# Patient Record
Sex: Female | Born: 1955
Health system: Southern US, Community
[De-identification: ages and names within clinical notes are randomized; demographics above are authoritative.]

## PROBLEM LIST (undated history)

## (undated) DIAGNOSIS — R3129 Other microscopic hematuria: Secondary | ICD-10-CM

## (undated) DIAGNOSIS — R29810 Facial weakness: Secondary | ICD-10-CM

## (undated) DIAGNOSIS — E785 Hyperlipidemia, unspecified: Secondary | ICD-10-CM

## (undated) DIAGNOSIS — E039 Hypothyroidism, unspecified: Secondary | ICD-10-CM

## (undated) DIAGNOSIS — K649 Unspecified hemorrhoids: Secondary | ICD-10-CM

## (undated) DIAGNOSIS — I10 Essential (primary) hypertension: Secondary | ICD-10-CM

## (undated) DIAGNOSIS — F5104 Psychophysiologic insomnia: Secondary | ICD-10-CM

## (undated) HISTORY — DX: Hypothyroidism, unspecified: E03.9

## (undated) HISTORY — DX: Facial weakness: R29.810

## (undated) HISTORY — PX: CYSTOSCOPY: SUR368

## (undated) HISTORY — PX: LAPAROSCOPIC CHOLECYSTECTOMY WITH COMMON DUCT EXPLORATION AND INTRAOP CHOLANGIOGRAM: SHX6333

## (undated) HISTORY — DX: Essential (primary) hypertension: I10

## (undated) HISTORY — DX: Unspecified hemorrhoids: K64.9

## (undated) HISTORY — PX: CHOLECYSTECTOMY: SHX55

## (undated) HISTORY — DX: Psychophysiologic insomnia: F51.04

## (undated) HISTORY — DX: Other microscopic hematuria: R31.29

## (undated) HISTORY — DX: Hyperlipidemia, unspecified: E78.5

---

## 2003-04-21 ENCOUNTER — Encounter: Admission: RE | Admit: 2003-04-21 | Discharge: 2003-04-21 | Payer: Self-pay | Admitting: Obstetrics and Gynecology

## 2003-04-21 ENCOUNTER — Other Ambulatory Visit: Admission: RE | Admit: 2003-04-21 | Discharge: 2003-04-21 | Payer: Self-pay | Admitting: Obstetrics and Gynecology

## 2005-12-28 ENCOUNTER — Emergency Department (HOSPITAL_COMMUNITY): Admission: EM | Admit: 2005-12-28 | Discharge: 2005-12-28 | Payer: Self-pay | Admitting: Emergency Medicine

## 2006-02-16 ENCOUNTER — Ambulatory Visit (HOSPITAL_COMMUNITY): Admission: RE | Admit: 2006-02-16 | Discharge: 2006-02-18 | Payer: Self-pay | Admitting: General Surgery

## 2010-08-04 ENCOUNTER — Other Ambulatory Visit: Payer: Self-pay | Admitting: Obstetrics and Gynecology

## 2014-03-12 ENCOUNTER — Ambulatory Visit (INDEPENDENT_AMBULATORY_CARE_PROVIDER_SITE_OTHER): Payer: BC Managed Care – PPO | Admitting: Podiatry

## 2014-03-12 VITALS — BP 133/79 | HR 82 | Resp 17

## 2014-03-12 DIAGNOSIS — M722 Plantar fascial fibromatosis: Secondary | ICD-10-CM

## 2014-03-12 MED ORDER — TRIAMCINOLONE ACETONIDE 10 MG/ML IJ SUSP
10.0000 mg | Freq: Once | INTRAMUSCULAR | Status: AC
  Administered 2014-03-12: 10 mg

## 2014-03-12 NOTE — Patient Instructions (Signed)
Plantar Fasciitis (Heel Spur Syndrome) with Rehab The plantar fascia is a fibrous, ligament-like, soft-tissue structure that spans the bottom of the foot. Plantar fasciitis is a condition that causes pain in the foot due to inflammation of the tissue. SYMPTOMS   Pain and tenderness on the underneath side of the foot.  Pain that worsens with standing or walking. CAUSES  Plantar fasciitis is caused by irritation and injury to the plantar fascia on the underneath side of the foot. Common mechanisms of injury include:  Direct trauma to bottom of the foot.  Damage to a small nerve that runs under the foot where the main fascia attaches to the heel bone.  Stress placed on the plantar fascia due to bone spurs. RISK INCREASES WITH:   Activities that place stress on the plantar fascia (running, jumping, pivoting, or cutting).  Poor strength and flexibility.  Improperly fitted shoes.  Tight calf muscles.  Flat feet.  Failure to warm-up properly before activity.  Obesity. PREVENTION  Warm up and stretch properly before activity.  Allow for adequate recovery between workouts.  Maintain physical fitness:  Strength, flexibility, and endurance.  Cardiovascular fitness.  Maintain a health body weight.  Avoid stress on the plantar fascia.  Wear properly fitted shoes, including arch supports for individuals who have flat feet. PROGNOSIS  If treated properly, then the symptoms of plantar fasciitis usually resolve without surgery. However, occasionally surgery is necessary. RELATED COMPLICATIONS   Recurrent symptoms that may result in a chronic condition.  Problems of the lower back that are caused by compensating for the injury, such as limping.  Pain or weakness of the foot during push-off following surgery.  Chronic inflammation, scarring, and partial or complete fascia tear, occurring more often from repeated injections. TREATMENT  Treatment initially involves the use of  ice and medication to help reduce pain and inflammation. The use of strengthening and stretching exercises may help reduce pain with activity, especially stretches of the Achilles tendon. These exercises may be performed at home or with a therapist. Your caregiver may recommend that you use heel cups of arch supports to help reduce stress on the plantar fascia. Occasionally, corticosteroid injections are given to reduce inflammation. If symptoms persist for greater than 6 months despite non-surgical (conservative), then surgery may be recommended.  MEDICATION   If pain medication is necessary, then nonsteroidal anti-inflammatory medications, such as aspirin and ibuprofen, or other minor pain relievers, such as acetaminophen, are often recommended.  Do not take pain medication within 7 days before surgery.  Prescription pain relievers may be given if deemed necessary by your caregiver. Use only as directed and only as much as you need.  Corticosteroid injections may be given by your caregiver. These injections should be reserved for the most serious cases, because they may only be given a certain number of times. HEAT AND COLD  Cold treatment (icing) relieves pain and reduces inflammation. Cold treatment should be applied for 10 to 15 minutes every 2 to 3 hours for inflammation and pain and immediately after any activity that aggravates your symptoms. Use ice packs or massage the area with a piece of ice (ice massage).  Heat treatment may be used prior to performing the stretching and strengthening activities prescribed by your caregiver, physical therapist, or athletic trainer. Use a heat pack or soak the injury in warm water. SEEK IMMEDIATE MEDICAL CARE IF:  Treatment seems to offer no benefit, or the condition worsens.  Any medications produce adverse side effects. EXERCISES RANGE   OF MOTION (ROM) AND STRETCHING EXERCISES - Plantar Fasciitis (Heel Spur Syndrome) These exercises may help you  when beginning to rehabilitate your injury. Your symptoms may resolve with or without further involvement from your physician, physical therapist or athletic trainer. While completing these exercises, remember:   Restoring tissue flexibility helps normal motion to return to the joints. This allows healthier, less painful movement and activity.  An effective stretch should be held for at least 30 seconds.  A stretch should never be painful. You should only feel a gentle lengthening or release in the stretched tissue. RANGE OF MOTION - Toe Extension, Flexion  Sit with your right / left leg crossed over your opposite knee.  Grasp your toes and gently pull them back toward the top of your foot. You should feel a stretch on the bottom of your toes and/or foot.  Hold this stretch for __________ seconds.  Now, gently pull your toes toward the bottom of your foot. You should feel a stretch on the top of your toes and or foot.  Hold this stretch for __________ seconds. Repeat __________ times. Complete this stretch __________ times per day.  RANGE OF MOTION - Ankle Dorsiflexion, Active Assisted  Remove shoes and sit on a chair that is preferably not on a carpeted surface.  Place right / left foot under knee. Extend your opposite leg for support.  Keeping your heel down, slide your right / left foot back toward the chair until you feel a stretch at your ankle or calf. If you do not feel a stretch, slide your bottom forward to the edge of the chair, while still keeping your heel down.  Hold this stretch for __________ seconds. Repeat __________ times. Complete this stretch __________ times per day.  STRETCH - Gastroc, Standing  Place hands on wall.  Extend right / left leg, keeping the front knee somewhat bent.  Slightly point your toes inward on your back foot.  Keeping your right / left heel on the floor and your knee straight, shift your weight toward the wall, not allowing your back to  arch.  You should feel a gentle stretch in the right / left calf. Hold this position for __________ seconds. Repeat __________ times. Complete this stretch __________ times per day. STRETCH - Soleus, Standing  Place hands on wall.  Extend right / left leg, keeping the other knee somewhat bent.  Slightly point your toes inward on your back foot.  Keep your right / left heel on the floor, bend your back knee, and slightly shift your weight over the back leg so that you feel a gentle stretch deep in your back calf.  Hold this position for __________ seconds. Repeat __________ times. Complete this stretch __________ times per day. STRETCH - Gastrocsoleus, Standing  Note: This exercise can place a lot of stress on your foot and ankle. Please complete this exercise only if specifically instructed by your caregiver.   Place the ball of your right / left foot on a step, keeping your other foot firmly on the same step.  Hold on to the wall or a rail for balance.  Slowly lift your other foot, allowing your body weight to press your heel down over the edge of the step.  You should feel a stretch in your right / left calf.  Hold this position for __________ seconds.  Repeat this exercise with a slight bend in your right / left knee. Repeat __________ times. Complete this stretch __________ times per day.    STRENGTHENING EXERCISES - Plantar Fasciitis (Heel Spur Syndrome)  These exercises may help you when beginning to rehabilitate your injury. They may resolve your symptoms with or without further involvement from your physician, physical therapist or athletic trainer. While completing these exercises, remember:   Muscles can gain both the endurance and the strength needed for everyday activities through controlled exercises.  Complete these exercises as instructed by your physician, physical therapist or athletic trainer. Progress the resistance and repetitions only as guided. STRENGTH -  Towel Curls  Sit in a chair positioned on a non-carpeted surface.  Place your foot on a towel, keeping your heel on the floor.  Pull the towel toward your heel by only curling your toes. Keep your heel on the floor.  If instructed by your physician, physical therapist or athletic trainer, add ____________________ at the end of the towel. Repeat __________ times. Complete this exercise __________ times per day. STRENGTH - Ankle Inversion  Secure one end of a rubber exercise band/tubing to a fixed object (table, pole). Loop the other end around your foot just before your toes.  Place your fists between your knees. This will focus your strengthening at your ankle.  Slowly, pull your big toe up and in, making sure the band/tubing is positioned to resist the entire motion.  Hold this position for __________ seconds.  Have your muscles resist the band/tubing as it slowly pulls your foot back to the starting position. Repeat __________ times. Complete this exercises __________ times per day.  Document Released: 04/18/2005 Document Revised: 07/11/2011 Document Reviewed: 07/31/2008 [employer] Patient Information 2015 [employer]. This information is not intended to replace advice given to you by your health care provider. Make sure you discuss any questions you have with your health care provider.  

## 2014-03-12 NOTE — Progress Notes (Signed)
   Subjective:    Patient ID: Oswaldo DoneKathleen M Ocallaghan, female    DOB: [birth year], 58 y.o.   MRN: [id number]  HPI 58 year old female presents the office today with complaints of left heel pain which has been ongoing for proximally 2 months. She states that she has pain in her heel on the bottom aspect which is worse in the morning/rest or after prolonged walking or standing. She states that she's been trying Motrin as well as ice to the area without much resolution of symptoms. She denies any trauma to the area. No other complaints at this time.   Review of Systems  All other systems reviewed and are negative.      Objective:   Physical Exam AAO x3, NAD DP/PT pulses palpable bilaterally, CRT less than 3 seconds Protective sensation intact with Simms Weinstein monofilament, vibratory sensation intact, Achilles tendon reflex intact Tenderness to palpation over the plantar medial tubercle of the left calcaneus at the insertion of the plantar fascia. There is no pain with lateral compression of the calcaneus or vibratory sensation. There is no pain along the posterior aspect of the calcaneus or along the course of the Achilles tendon. No pain on the course of the plantar fascia within the arch of the foot and the plantar fascia appears to be intact. No overlying edema, erythema, increased warmth. No tenderness to palpation over the right foot. MMT 5/5, ROM WNL       Assessment & Plan:  58 year old female with left heel pain, likely plantar fasciitis. -Previous x-rays were reviewed. No acute fracture identified. Plantar calcaneal heel spurring identified. -Conservative versus surgical options were discussed including alternatives, risks, complications. -Patient elects to proceed with steroid injection into the left heel. Under sterile skin preparation, a total of 2.5cc of kenalog 10, 0.5% Marcaine plain, and 2% lidocaine plain were infiltrated into the symptomatic area without complication. A band-aid  was applied. Patient tolerated the injection well without complication.  -Ice to the effected area. -Discussed stretching exercises. -Dispensed plantar fascial brace. -Discussed shoe gear modifications and possible orthotics to help support her foot type. -follow-up in 3 weeks or sooner if any problems are to arise. In the meantime, call the office with any questions, concerns, change in symptoms.

## 2014-04-02 ENCOUNTER — Other Ambulatory Visit: Payer: Self-pay

## 2014-04-04 ENCOUNTER — Encounter: Payer: Self-pay | Admitting: Podiatry

## 2014-04-04 ENCOUNTER — Ambulatory Visit (INDEPENDENT_AMBULATORY_CARE_PROVIDER_SITE_OTHER): Payer: BC Managed Care – PPO | Admitting: Podiatry

## 2014-04-04 VITALS — BP 134/94 | HR 96 | Resp 13

## 2014-04-04 DIAGNOSIS — M722 Plantar fascial fibromatosis: Secondary | ICD-10-CM

## 2014-04-04 LAB — CYTOLOGY - PAP

## 2014-04-04 MED ORDER — TRIAMCINOLONE ACETONIDE 10 MG/ML IJ SUSP
10.0000 mg | Freq: Once | INTRAMUSCULAR | Status: AC
  Administered 2014-04-04: 10 mg

## 2014-04-04 NOTE — Progress Notes (Signed)
Patient ID: Oswaldo DoneKathleen M Erhart, female   DOB: 06-17-1955, 58 y.o.   MRN: [id number]  Subjective: 58 year old female returns the office they for follow-up evaluation of left heel pain, plantar fasciitis. She states that since the injection last appointment she has had much improvement in her symptoms although she does continue have some slight discomfort. She states that the pain is mostly after periods of ambulation and walking. She denies any recent swelling or redness over the area. No recent trauma. Denies any systemic complaints as fevers, chills, nausea, vomiting. No other complaints at this time in no acute changes since last appointment.  Objective: AAO x3, NAD DP/PT pulses palpable bilaterally, CRT less than 3 seconds  Protective sensation intact with Simms Weinstein monofilament, vibratory sensation intact, Achilles tendon reflex intact Tenderness to palpation overlying the plantar medial tubercle of the calcaneus at the insertion of the plantar fascia on the left foot. There is no pain along the course of plantar fascial in the arch of the foot. There is no pain with lateral compression of the calcaneus or pain with vibratory sensation. No pain on the posterior aspect of the calcaneus on the course of/insertion of the Achilles tendon. MMT 5/5, ROM WNL No open lesions or pre-ulcerative lesions. No pain with calf compression, swelling, warmth, erythema  Assessment: 58 year old female with left heel pain, plantar fasciitis.  Plan: -Treatment options discussed including all alternatives, risks, complications. -Patient elects to proceed with steroid injection into the left heel. Under sterile skin preparation, a total of 2.5cc of kenalog 10, 0.5% Marcaine plain, and 2% lidocaine plain were infiltrated into the symptomatic area without complication. A band-aid was applied. Patient tolerated the injection well without complication. Discussed with the patient to ice the area over the next couple of  days to help prevent a steroid flare.  -Continue ice to the area -Continue stretching exercises.  -Discussed proper shoegear -Follow-up in 3 weeks, or sooner if any problems are to arise. In the meantime, call the office with any questions/concerns/change in symptoms.

## 2014-04-04 NOTE — Patient Instructions (Signed)
Plantar Fasciitis (Heel Spur Syndrome) with Rehab The plantar fascia is a fibrous, ligament-like, soft-tissue structure that spans the bottom of the foot. Plantar fasciitis is a condition that causes pain in the foot due to inflammation of the tissue. SYMPTOMS   Pain and tenderness on the underneath side of the foot.  Pain that worsens with standing or walking. CAUSES  Plantar fasciitis is caused by irritation and injury to the plantar fascia on the underneath side of the foot. Common mechanisms of injury include:  Direct trauma to bottom of the foot.  Damage to a small nerve that runs under the foot where the main fascia attaches to the heel bone.  Stress placed on the plantar fascia due to bone spurs. RISK INCREASES WITH:   Activities that place stress on the plantar fascia (running, jumping, pivoting, or cutting).  Poor strength and flexibility.  Improperly fitted shoes.  Tight calf muscles.  Flat feet.  Failure to warm-up properly before activity.  Obesity. PREVENTION  Warm up and stretch properly before activity.  Allow for adequate recovery between workouts.  Maintain physical fitness:  Strength, flexibility, and endurance.  Cardiovascular fitness.  Maintain a health body weight.  Avoid stress on the plantar fascia.  Wear properly fitted shoes, including arch supports for individuals who have flat feet. PROGNOSIS  If treated properly, then the symptoms of plantar fasciitis usually resolve without surgery. However, occasionally surgery is necessary. RELATED COMPLICATIONS   Recurrent symptoms that may result in a chronic condition.  Problems of the lower back that are caused by compensating for the injury, such as limping.  Pain or weakness of the foot during push-off following surgery.  Chronic inflammation, scarring, and partial or complete fascia tear, occurring more often from repeated injections. TREATMENT  Treatment initially involves the use of  ice and medication to help reduce pain and inflammation. The use of strengthening and stretching exercises may help reduce pain with activity, especially stretches of the Achilles tendon. These exercises may be performed at home or with a therapist. Your caregiver may recommend that you use heel cups of arch supports to help reduce stress on the plantar fascia. Occasionally, corticosteroid injections are given to reduce inflammation. If symptoms persist for greater than 6 months despite non-surgical (conservative), then surgery may be recommended.  MEDICATION   If pain medication is necessary, then nonsteroidal anti-inflammatory medications, such as aspirin and ibuprofen, or other minor pain relievers, such as acetaminophen, are often recommended.  Do not take pain medication within 7 days before surgery.  Prescription pain relievers may be given if deemed necessary by your caregiver. Use only as directed and only as much as you need.  Corticosteroid injections may be given by your caregiver. These injections should be reserved for the most serious cases, because they may only be given a certain number of times. HEAT AND COLD  Cold treatment (icing) relieves pain and reduces inflammation. Cold treatment should be applied for 10 to 15 minutes every 2 to 3 hours for inflammation and pain and immediately after any activity that aggravates your symptoms. Use ice packs or massage the area with a piece of ice (ice massage).  Heat treatment may be used prior to performing the stretching and strengthening activities prescribed by your caregiver, physical therapist, or athletic trainer. Use a heat pack or soak the injury in warm water. SEEK IMMEDIATE MEDICAL CARE IF:  Treatment seems to offer no benefit, or the condition worsens.  Any medications produce adverse side effects. EXERCISES RANGE   OF MOTION (ROM) AND STRETCHING EXERCISES - Plantar Fasciitis (Heel Spur Syndrome) These exercises may help you  when beginning to rehabilitate your injury. Your symptoms may resolve with or without further involvement from your physician, physical therapist or athletic trainer. While completing these exercises, remember:   Restoring tissue flexibility helps normal motion to return to the joints. This allows healthier, less painful movement and activity.  An effective stretch should be held for at least 30 seconds.  A stretch should never be painful. You should only feel a gentle lengthening or release in the stretched tissue. RANGE OF MOTION - Toe Extension, Flexion  Sit with your right / left leg crossed over your opposite knee.  Grasp your toes and gently pull them back toward the top of your foot. You should feel a stretch on the bottom of your toes and/or foot.  Hold this stretch for __________ seconds.  Now, gently pull your toes toward the bottom of your foot. You should feel a stretch on the top of your toes and or foot.  Hold this stretch for __________ seconds. Repeat __________ times. Complete this stretch __________ times per day.  RANGE OF MOTION - Ankle Dorsiflexion, Active Assisted  Remove shoes and sit on a chair that is preferably not on a carpeted surface.  Place right / left foot under knee. Extend your opposite leg for support.  Keeping your heel down, slide your right / left foot back toward the chair until you feel a stretch at your ankle or calf. If you do not feel a stretch, slide your bottom forward to the edge of the chair, while still keeping your heel down.  Hold this stretch for __________ seconds. Repeat __________ times. Complete this stretch __________ times per day.  STRETCH - Gastroc, Standing  Place hands on wall.  Extend right / left leg, keeping the front knee somewhat bent.  Slightly point your toes inward on your back foot.  Keeping your right / left heel on the floor and your knee straight, shift your weight toward the wall, not allowing your back to  arch.  You should feel a gentle stretch in the right / left calf. Hold this position for __________ seconds. Repeat __________ times. Complete this stretch __________ times per day. STRETCH - Soleus, Standing  Place hands on wall.  Extend right / left leg, keeping the other knee somewhat bent.  Slightly point your toes inward on your back foot.  Keep your right / left heel on the floor, bend your back knee, and slightly shift your weight over the back leg so that you feel a gentle stretch deep in your back calf.  Hold this position for __________ seconds. Repeat __________ times. Complete this stretch __________ times per day. STRETCH - Gastrocsoleus, Standing  Note: This exercise can place a lot of stress on your foot and ankle. Please complete this exercise only if specifically instructed by your caregiver.   Place the ball of your right / left foot on a step, keeping your other foot firmly on the same step.  Hold on to the wall or a rail for balance.  Slowly lift your other foot, allowing your body weight to press your heel down over the edge of the step.  You should feel a stretch in your right / left calf.  Hold this position for __________ seconds.  Repeat this exercise with a slight bend in your right / left knee. Repeat __________ times. Complete this stretch __________ times per day.    STRENGTHENING EXERCISES - Plantar Fasciitis (Heel Spur Syndrome)  These exercises may help you when beginning to rehabilitate your injury. They may resolve your symptoms with or without further involvement from your physician, physical therapist or athletic trainer. While completing these exercises, remember:   Muscles can gain both the endurance and the strength needed for everyday activities through controlled exercises.  Complete these exercises as instructed by your physician, physical therapist or athletic trainer. Progress the resistance and repetitions only as guided. STRENGTH -  Towel Curls  Sit in a chair positioned on a non-carpeted surface.  Place your foot on a towel, keeping your heel on the floor.  Pull the towel toward your heel by only curling your toes. Keep your heel on the floor.  If instructed by your physician, physical therapist or athletic trainer, add ____________________ at the end of the towel. Repeat __________ times. Complete this exercise __________ times per day. STRENGTH - Ankle Inversion  Secure one end of a rubber exercise band/tubing to a fixed object (table, pole). Loop the other end around your foot just before your toes.  Place your fists between your knees. This will focus your strengthening at your ankle.  Slowly, pull your big toe up and in, making sure the band/tubing is positioned to resist the entire motion.  Hold this position for __________ seconds.  Have your muscles resist the band/tubing as it slowly pulls your foot back to the starting position. Repeat __________ times. Complete this exercises __________ times per day.  Document Released: 04/18/2005 Document Revised: 07/11/2011 Document Reviewed: 07/31/2008 [employer] Patient Information 2015 [employer]. This information is not intended to replace advice given to you by your health care provider. Make sure you discuss any questions you have with your health care provider.  

## 2014-04-07 ENCOUNTER — Other Ambulatory Visit: Payer: Self-pay | Admitting: Obstetrics

## 2014-04-07 DIAGNOSIS — R928 Other abnormal and inconclusive findings on diagnostic imaging of breast: Secondary | ICD-10-CM

## 2014-04-17 ENCOUNTER — Encounter (INDEPENDENT_AMBULATORY_CARE_PROVIDER_SITE_OTHER): Payer: Self-pay

## 2014-04-17 ENCOUNTER — Ambulatory Visit
Admission: RE | Admit: 2014-04-17 | Discharge: 2014-04-17 | Disposition: A | Discharge status: Home / Self Care | Payer: BC Managed Care – PPO | Source: Ambulatory Visit | Attending: Obstetrics | Admitting: Obstetrics

## 2014-04-17 DIAGNOSIS — R928 Other abnormal and inconclusive findings on diagnostic imaging of breast: Secondary | ICD-10-CM

## 2014-04-22 ENCOUNTER — Other Ambulatory Visit: Payer: Self-pay

## 2014-05-07 ENCOUNTER — Ambulatory Visit: Payer: BC Managed Care – PPO | Admitting: Podiatry

## 2015-04-17 ENCOUNTER — Other Ambulatory Visit: Payer: Self-pay

## 2015-04-17 DIAGNOSIS — Z1231 Encounter for screening mammogram for malignant neoplasm of breast: Secondary | ICD-10-CM

## 2015-04-30 ENCOUNTER — Other Ambulatory Visit: Payer: Self-pay | Admitting: Internal Medicine

## 2015-04-30 DIAGNOSIS — R29810 Facial weakness: Secondary | ICD-10-CM

## 2015-05-07 ENCOUNTER — Ambulatory Visit
Admission: RE | Admit: 2015-05-07 | Discharge: 2015-05-07 | Disposition: A | Discharge status: Home / Self Care | Payer: BLUE CROSS/BLUE SHIELD | Source: Ambulatory Visit

## 2015-05-07 DIAGNOSIS — Z1231 Encounter for screening mammogram for malignant neoplasm of breast: Secondary | ICD-10-CM

## 2015-05-12 ENCOUNTER — Ambulatory Visit
Admission: RE | Admit: 2015-05-12 | Discharge: 2015-05-12 | Disposition: A | Discharge status: Home / Self Care | Payer: BLUE CROSS/BLUE SHIELD | Source: Ambulatory Visit | Attending: Internal Medicine | Admitting: Internal Medicine

## 2015-05-12 DIAGNOSIS — R29810 Facial weakness: Secondary | ICD-10-CM

## 2015-05-14 ENCOUNTER — Ambulatory Visit (INDEPENDENT_AMBULATORY_CARE_PROVIDER_SITE_OTHER): Payer: BLUE CROSS/BLUE SHIELD | Admitting: Neurology

## 2015-05-14 ENCOUNTER — Encounter: Payer: Self-pay | Admitting: Neurology

## 2015-05-14 VITALS — BP 148/98 | HR 80 | Resp 18 | Ht 65.75 in | Wt 254.0 lb

## 2015-05-14 DIAGNOSIS — G518 Other disorders of facial nerve: Secondary | ICD-10-CM

## 2015-05-14 DIAGNOSIS — G510 Bell's palsy: Secondary | ICD-10-CM

## 2015-05-14 DIAGNOSIS — G5139 Clonic hemifacial spasm, unspecified: Secondary | ICD-10-CM

## 2015-05-14 NOTE — Patient Instructions (Signed)
You have mild Bell's Palsy: weakness of your facial muscle due to irritation of your facial nerve on the left. Bell's palsy gets better with time, and it can take months. Continue doing your facial exercises. Use artificial tears to your affected eye during the day as needed if you have dry eyes. Your brain MRI was fine.  You also have evidence of what we call hemifacial spasm which causes twitching of the facial muscles. This is mild at this time and can improve with time. If it gets worse, we can consider botulinum toxin injections to reduce your facial twitching. For now, we can play it by ear.  Please ask your husband, if you snore and if so, how loud it is, and if you have breathing related issues in your sleep, such as: snorting sounds, choking sounds, pauses in your breathing or shallow breathing events. These may be symptoms of obstructive sleep apnea (OSA).

## 2015-05-14 NOTE — Progress Notes (Signed)
Subjective:    Patient ID: Brittany Riley is a 60 y.o. female.  HPI     Huston FoleySaima Kainoah Bartosiewicz, MD, PhD [facility] [address], [address] [address] [place], [place] [address]  Dear Dr. [name],   I saw your patient, Brittany CongoKathleen Boddy, upon your kind request in my neurologic clinic today for initial consultation of her facial weakness. The patient is unaccompanied today. As you know, Ms. Brittany RuedFaison is a 60 year old right-handed woman with an underlying medical history of hypertension, hyperlipidemia, hypothyroidism, and morbid obesity, who reports new onset left facial weakness and left facial twitching. Her symptoms started in September 2016 when she first noticed intermittent twitching of her left face. Around Thanksgiving 2016 she noticed facial weakness. Since then, she has improved some with regards to her facial weakness. Her facial twitching is not always the same. It comes and goes. It is somewhat bothersome to her but not all the time. She has no pain. She had no prodromal illness as far she recalls. She had no other weakness or numbness or tingling elsewhere in her body. She had no headache and no ear pain, no hearing loss or exaggerated sound sensitivity. Overall, she feels quite well. She also recalls no additional stressors or problems at the time. She feels that she sleeps fairly well but she does snore some. She is motivated to lose weight. She denies any gasping sensations, morning headaches but does have occasional nocturia.  You ordered a brain MRI. She had a brain MRI without contrast on 05/12/2015: IMPRESSION: Normal for age noncontrast MRI appearance of the brain.  In addition, I personally reviewed the images through the PACS system.   I reviewed your office note from 04/22/2015, which you kindly included.  Her Past Medical History Is Significant For: Past Medical History  Diagnosis Date  . Hypertension   . Hemorrhoids   . Dyslipidemia   . Microscopic  hematuria   . Hypothyroidism   . Facial droop   . Chronic insomnia     Her Past Surgical History Is Significant For: Past Surgical History  Procedure Laterality Date  . Laparoscopic cholecystectomy with common duct exploration and intraop cholangiogram    . Cystoscopy    . Cesarean section    . Cholecystectomy      Her Family History Is Significant For: Family History  Problem Relation Age of Onset  . Hypertension Mother   . COPD Mother   . Diabetes Father   . Coronary artery disease Father   . Hypertension Sister   . Hodgkin's lymphoma Brother     Her Social History Is Significant For: Social History   Social History  . Marital Status: Married    Spouse Name: N/A  . Number of Children: 2  . Years of Education: College   Occupational History  . None     Social History Main Topics  . Smoking status: Never Smoker   . Smokeless tobacco: None  . Alcohol Use: 0.0 oz/week    0 Standard drinks or equivalent per week     Comment: Rare  . Drug Use: No  . Sexual Activity: Not Asked   Other Topics Concern  . None   Social History Narrative   Drinks 2 diet sodas a day     Her Allergies Are:  Allergies  Allergen Reactions  . Ampicillin   . Penicillins Rash  :   Her Current Medications Are:  Outpatient Encounter Prescriptions as of 05/14/2015  Medication Sig  .  levothyroxine (SYNTHROID, LEVOTHROID) 75 MCG tablet Take 75 mcg by mouth daily before breakfast.  . losartan (COZAAR) 100 MG tablet Take 100 mg by mouth daily.  . magnesium oxide (MAG-OX) 400 MG tablet Take 400 mg by mouth daily.   No facility-administered encounter medications on file as of 05/14/2015.  :   Review of Systems:  Out of a complete 14 point review of systems, all are reviewed and negative with the exception of these symptoms as listed below:   Review of Systems  Neurological:       Patient reports that 01/2015 she started having L facial spasms. Started to notice L facial droop around  Thanksgiving 2016. Having some decreased sensation in upper lip.     Objective:  Neurologic Exam  Physical Exam Physical Examination:   Filed Vitals:   05/14/15 0952  BP: 148/98  Pulse: 80  Resp: 18    General Examination: The patient is a very pleasant 60 y.o. female in no acute distress. She appears well-developed and well-nourished and well groomed. She is obese.  HEENT: Normocephalic, atraumatic, pupils are equal, round and reactive to light and accommodation. Funduscopic exam is normal with sharp disc margins noted. Extraocular tracking is good without limitation to gaze excursion or nystagmus noted. Normal smooth pursuit is noted. Hearing is grossly intact. Face is mildly asymmetric with normal facial animation on the right and mild left facial weakness noticed on the L with depressed NLF and with normal facial sensation and mild intermittent L hemi-facial spasms noted in the L lower lid area, L mid and lower face around chin. Speech is clear with no dysarthria noted. There is no hypophonia. There is no lip, neck/head, jaw or voice tremor. Neck is supple with full range of passive and active motion. There are no carotid bruits on auscultation. Oropharynx exam reveals: mild mouth dryness, adequate dental hygiene and moderate airway crowding, due to narrow airway entry and tonsils in place, uvula appears slightly elongated. Mallampati is class II. Tongue protrudes centrally and palate elevates symmetrically.   Chest: Clear to auscultation without wheezing, rhonchi or crackles noted.  Heart: S1+S2+0, regular and normal without murmurs, rubs or gallops noted.   Abdomen: Soft, non-tender and non-distended with normal bowel sounds appreciated on auscultation.  Extremities: There is no pitting edema in the distal lower extremities bilaterally. Pedal pulses are intact.  Skin: Warm and dry without trophic changes noted. There are no varicose veins.  Musculoskeletal: exam reveals no obvious  joint deformities, tenderness or joint swelling or erythema.   Neurologically:  Mental status: The patient is awake, alert and oriented in all 4 spheres. Her immediate and remote memory, attention, language skills and fund of knowledge are appropriate. There is no evidence of aphasia, agnosia, apraxia or anomia. Speech is clear with normal prosody and enunciation. Thought process is linear. Mood is normal and affect is normal.  Cranial nerves II - XII are as described above under HEENT exam. In addition: shoulder shrug is normal with equal shoulder height noted. Motor exam: Normal bulk, strength and tone is noted. There is no drift, tremor or rebound. Romberg is negative. Reflexes are 2+ throughout. Babinski: Toes are flexor bilaterally. Fine motor skills and coordination: intact with normal finger taps, normal hand movements, normal rapid alternating patting, normal foot taps and normal foot agility.  Cerebellar testing: No dysmetria or intention tremor on finger to nose testing. Heel to shin is unremarkable bilaterally. There is no truncal or gait ataxia.  Sensory exam: intact to  light touch, pinprick, vibration, temperature sense in the upper and lower extremities.  Gait, station and balance: She stands easily. No veering to one side is noted. No leaning to one side is noted. Posture is age-appropriate and stance is narrow based. Gait shows normal stride length and normal pace. No problems turning are noted. She turns en bloc. Tandem walk is unremarkable.  Assessment and Plan:   In summary, VARNELL DONATE is a very pleasant 60 y.o.-year old female with an underlying medical history of hypertension, hyperlipidemia, hypothyroidism, and morbid obesity, who  started having left facial twitching and left facial weakness a couple of months ago. First twitching she recalls started first in September 2016 and facial weakness started late in November 2016. On examination, she has mild evidence of facial  weakness on the left and mild evidence of left hemifacial spasms. Symptoms are mild and may have improved some. MRI was negative and was discussed with her. She is reassured that her neurological exam otherwise is nonfocal. At this juncture, I suggested that she continue with facial exercises, use eyedrops to the left eye for lubrication as needed. If the facial twitching becomes worse or starts bothering her she is advised to call, we discussed potential use of botulinum toxin injections to reduce facial spasms, otherwise, she is advised, that there is no specific treatment available. With time, she may improve. I answered all her questions today and she was in agreement with an as needed follow-up with me. In addition, we also talked about her sleep some today because she  has a crowded looking airway and snores and is obese. She does not endorse any telltale symptoms of obstructive sleep apnea but is advised to talk to her husband about her sleep and whether he has noticed any abnormal sounds or pauses in her breathing while she is asleep. At this juncture, I will see her back on an as-needed basis.   Thank you very much for allowing me to participate in the care of this nice patient. If I can be of any further assistance to you please do not hesitate to call me at [phone].  Sincerely,   [name], MD, PhD

## 2015-06-03 DIAGNOSIS — G510 Bell's palsy: Secondary | ICD-10-CM | POA: Insufficient documentation

## 2015-06-03 DIAGNOSIS — I10 Essential (primary) hypertension: Secondary | ICD-10-CM | POA: Insufficient documentation

## 2015-06-03 DIAGNOSIS — E039 Hypothyroidism, unspecified: Secondary | ICD-10-CM | POA: Insufficient documentation

## 2015-08-25 ENCOUNTER — Telehealth: Payer: Self-pay | Admitting: Neurology

## 2015-08-25 NOTE — Telephone Encounter (Signed)
A muscle relaxer is not recommended for Bell's palsy and twitching of the face, as it can cause side effects such as sleepiness and typically does not help very much for facial twitching. If anything, we can make an appointment for evaluation for Botox injections as discussed during our appointment in January 2017. Please call patient back to discuss.

## 2015-08-25 NOTE — Telephone Encounter (Signed)
Patient is calling and is asking if a muscle relaxer could help release the muscle in her face (Bell's Palsy patient)?  Please call her [phone].

## 2015-08-26 NOTE — Telephone Encounter (Signed)
FYI: Patient is aware of information below and recommendations. She does not want to pursue Botox at this time she was just wondering if there were any alternatives.

## 2015-12-23 DIAGNOSIS — H524 Presbyopia: Secondary | ICD-10-CM | POA: Diagnosis not present

## 2016-05-09 DIAGNOSIS — Z0000 Encounter for general adult medical examination without abnormal findings: Secondary | ICD-10-CM | POA: Diagnosis not present

## 2016-05-09 DIAGNOSIS — E038 Other specified hypothyroidism: Secondary | ICD-10-CM | POA: Diagnosis not present

## 2016-05-09 DIAGNOSIS — I10 Essential (primary) hypertension: Secondary | ICD-10-CM | POA: Diagnosis not present

## 2016-05-09 DIAGNOSIS — R8299 Other abnormal findings in urine: Secondary | ICD-10-CM | POA: Diagnosis not present

## 2016-05-12 ENCOUNTER — Other Ambulatory Visit: Payer: Self-pay | Admitting: Internal Medicine

## 2016-05-12 DIAGNOSIS — Z1231 Encounter for screening mammogram for malignant neoplasm of breast: Secondary | ICD-10-CM

## 2016-05-23 DIAGNOSIS — Z1389 Encounter for screening for other disorder: Secondary | ICD-10-CM | POA: Diagnosis not present

## 2016-05-23 DIAGNOSIS — E038 Other specified hypothyroidism: Secondary | ICD-10-CM | POA: Diagnosis not present

## 2016-05-23 DIAGNOSIS — E784 Other hyperlipidemia: Secondary | ICD-10-CM | POA: Diagnosis not present

## 2016-05-23 DIAGNOSIS — Z0000 Encounter for general adult medical examination without abnormal findings: Secondary | ICD-10-CM | POA: Diagnosis not present

## 2016-05-23 DIAGNOSIS — R29810 Facial weakness: Secondary | ICD-10-CM | POA: Diagnosis not present

## 2016-05-26 ENCOUNTER — Ambulatory Visit
Admission: RE | Admit: 2016-05-26 | Discharge: 2016-05-26 | Disposition: A | Discharge status: Home / Self Care | Payer: BLUE CROSS/BLUE SHIELD | Source: Ambulatory Visit | Attending: Internal Medicine | Admitting: Internal Medicine

## 2016-05-26 DIAGNOSIS — Z1231 Encounter for screening mammogram for malignant neoplasm of breast: Secondary | ICD-10-CM | POA: Diagnosis not present

## 2016-06-07 DIAGNOSIS — Z6841 Body Mass Index (BMI) 40.0 and over, adult: Secondary | ICD-10-CM | POA: Diagnosis not present

## 2016-06-07 DIAGNOSIS — Z01419 Encounter for gynecological examination (general) (routine) without abnormal findings: Secondary | ICD-10-CM | POA: Diagnosis not present

## 2017-04-07 DIAGNOSIS — H524 Presbyopia: Secondary | ICD-10-CM | POA: Diagnosis not present

## 2017-05-18 DIAGNOSIS — I10 Essential (primary) hypertension: Secondary | ICD-10-CM | POA: Diagnosis not present

## 2017-05-18 DIAGNOSIS — Z0000 Encounter for general adult medical examination without abnormal findings: Secondary | ICD-10-CM | POA: Diagnosis not present

## 2017-05-18 DIAGNOSIS — R82998 Other abnormal findings in urine: Secondary | ICD-10-CM | POA: Diagnosis not present

## 2017-05-25 DIAGNOSIS — Z1389 Encounter for screening for other disorder: Secondary | ICD-10-CM | POA: Diagnosis not present

## 2017-05-25 DIAGNOSIS — Z0000 Encounter for general adult medical examination without abnormal findings: Secondary | ICD-10-CM | POA: Diagnosis not present

## 2017-05-25 DIAGNOSIS — M25561 Pain in right knee: Secondary | ICD-10-CM | POA: Diagnosis not present

## 2017-05-25 DIAGNOSIS — E038 Other specified hypothyroidism: Secondary | ICD-10-CM | POA: Diagnosis not present

## 2017-05-25 DIAGNOSIS — I10 Essential (primary) hypertension: Secondary | ICD-10-CM | POA: Diagnosis not present

## 2017-05-25 DIAGNOSIS — E7849 Other hyperlipidemia: Secondary | ICD-10-CM | POA: Diagnosis not present

## 2017-05-30 ENCOUNTER — Other Ambulatory Visit: Payer: Self-pay | Admitting: Internal Medicine

## 2017-05-30 DIAGNOSIS — Z139 Encounter for screening, unspecified: Secondary | ICD-10-CM

## 2017-06-22 ENCOUNTER — Ambulatory Visit: Payer: BLUE CROSS/BLUE SHIELD

## 2017-07-11 ENCOUNTER — Ambulatory Visit: Payer: BLUE CROSS/BLUE SHIELD

## 2017-07-13 DIAGNOSIS — Z124 Encounter for screening for malignant neoplasm of cervix: Secondary | ICD-10-CM | POA: Diagnosis not present

## 2017-07-13 DIAGNOSIS — Z6841 Body Mass Index (BMI) 40.0 and over, adult: Secondary | ICD-10-CM | POA: Diagnosis not present

## 2017-07-13 DIAGNOSIS — Z01419 Encounter for gynecological examination (general) (routine) without abnormal findings: Secondary | ICD-10-CM | POA: Diagnosis not present

## 2017-07-26 ENCOUNTER — Ambulatory Visit
Admission: RE | Admit: 2017-07-26 | Discharge: 2017-07-26 | Disposition: A | Discharge status: Home / Self Care | Payer: BLUE CROSS/BLUE SHIELD | Source: Ambulatory Visit | Attending: Internal Medicine | Admitting: Internal Medicine

## 2017-07-26 DIAGNOSIS — Z1231 Encounter for screening mammogram for malignant neoplasm of breast: Secondary | ICD-10-CM | POA: Diagnosis not present

## 2017-07-26 DIAGNOSIS — Z139 Encounter for screening, unspecified: Secondary | ICD-10-CM

## 2017-08-10 DIAGNOSIS — E038 Other specified hypothyroidism: Secondary | ICD-10-CM | POA: Diagnosis not present

## 2018-05-22 DIAGNOSIS — Z0000 Encounter for general adult medical examination without abnormal findings: Secondary | ICD-10-CM | POA: Diagnosis not present

## 2018-05-22 DIAGNOSIS — E038 Other specified hypothyroidism: Secondary | ICD-10-CM | POA: Diagnosis not present

## 2018-05-22 DIAGNOSIS — R82998 Other abnormal findings in urine: Secondary | ICD-10-CM | POA: Diagnosis not present

## 2018-05-22 DIAGNOSIS — I10 Essential (primary) hypertension: Secondary | ICD-10-CM | POA: Diagnosis not present

## 2018-05-29 DIAGNOSIS — I10 Essential (primary) hypertension: Secondary | ICD-10-CM | POA: Diagnosis not present

## 2018-05-29 DIAGNOSIS — Z0000 Encounter for general adult medical examination without abnormal findings: Secondary | ICD-10-CM | POA: Diagnosis not present

## 2018-05-29 DIAGNOSIS — Z1331 Encounter for screening for depression: Secondary | ICD-10-CM | POA: Diagnosis not present

## 2018-05-29 DIAGNOSIS — M25561 Pain in right knee: Secondary | ICD-10-CM | POA: Diagnosis not present

## 2018-05-29 DIAGNOSIS — B351 Tinea unguium: Secondary | ICD-10-CM | POA: Diagnosis not present

## 2018-06-22 ENCOUNTER — Other Ambulatory Visit: Payer: Self-pay | Admitting: Internal Medicine

## 2018-06-22 DIAGNOSIS — Z1231 Encounter for screening mammogram for malignant neoplasm of breast: Secondary | ICD-10-CM

## 2018-07-31 ENCOUNTER — Ambulatory Visit: Payer: BLUE CROSS/BLUE SHIELD

## 2018-09-20 ENCOUNTER — Ambulatory Visit: Payer: BLUE CROSS/BLUE SHIELD

## 2018-10-03 DIAGNOSIS — Z01419 Encounter for gynecological examination (general) (routine) without abnormal findings: Secondary | ICD-10-CM | POA: Diagnosis not present

## 2018-10-11 ENCOUNTER — Ambulatory Visit (INDEPENDENT_AMBULATORY_CARE_PROVIDER_SITE_OTHER): Payer: BC Managed Care – PPO

## 2018-10-11 ENCOUNTER — Other Ambulatory Visit: Payer: Self-pay

## 2018-10-11 ENCOUNTER — Ambulatory Visit (INDEPENDENT_AMBULATORY_CARE_PROVIDER_SITE_OTHER): Payer: BC Managed Care – PPO | Admitting: Podiatry

## 2018-10-11 ENCOUNTER — Encounter: Payer: Self-pay | Admitting: Podiatry

## 2018-10-11 VITALS — Temp 97.2°F

## 2018-10-11 DIAGNOSIS — M79671 Pain in right foot: Secondary | ICD-10-CM

## 2018-10-11 DIAGNOSIS — M722 Plantar fascial fibromatosis: Secondary | ICD-10-CM | POA: Diagnosis not present

## 2018-10-11 MED ORDER — MELOXICAM 15 MG PO TABS: 15.0000 mg | ORAL_TABLET | Freq: Every day | ORAL | 0 refills | Status: DC

## 2018-10-11 MED ORDER — TRIAMCINOLONE ACETONIDE 10 MG/ML IJ SUSP
10.0000 mg | Freq: Once | INTRAMUSCULAR | Status: AC
  Administered 2018-10-11: 10 mg

## 2018-10-11 NOTE — Patient Instructions (Signed)

## 2018-10-17 NOTE — Progress Notes (Signed)
Subjective:   Patient ID: Brittany Riley, female   DOB: 63 y.o.   MRN: [id number]   HPI 63 year old female presents the office today for concerns of recurrent right heel pain which is been ongoing for last 2 to 3 months.  She states it hurts on when she first gets up and gets better with walking.  Describes it as sore, tender.  No numbness or tingling.  Has an occasional swelling.  No recent injury.  No other concerns today.   Review of Systems  All other systems reviewed and are negative.  Past Medical History:  Diagnosis Date  . Chronic insomnia   . Dyslipidemia   . Facial droop   . Hemorrhoids   . Hypertension   . Hypothyroidism   . Microscopic hematuria     Past Surgical History:  Procedure Laterality Date  . CESAREAN SECTION    . CHOLECYSTECTOMY    . CYSTOSCOPY    . LAPAROSCOPIC CHOLECYSTECTOMY WITH COMMON DUCT EXPLORATION AND INTRAOP CHOLANGIOGRAM       Current Outpatient Medications:  .  levothyroxine (SYNTHROID, LEVOTHROID) 75 MCG tablet, Take 75 mcg by mouth daily before breakfast., Disp: , Rfl:  .  losartan (COZAAR) 100 MG tablet, Take 100 mg by mouth daily., Disp: , Rfl:  .  meloxicam (MOBIC) 15 MG tablet, Take 1 tablet (15 mg total) by mouth daily., Disp: 14 tablet, Rfl: 0  Allergies  Allergen Reactions  . Ampicillin   . Penicillins Rash        Objective:  Physical Exam  General: AAO x3, NAD  Dermatological: Skin is warm, dry and supple bilateral. Nails x 10 are well manicured; remaining integument appears unremarkable at this time. There are no open sores, no preulcerative lesions, no rash or signs of infection present.  Vascular: Dorsalis Pedis artery and Posterior Tibial artery pedal pulses are 2/4 bilateral with immedate capillary fill time. Pedal hair growth present. No varicosities and no lower extremity edema present bilateral. There is no pain with calf compression, swelling, warmth, erythema.   Neruologic: Grossly intact via light touch  bilateral. Vibratory intact via tuning fork bilateral. Protective threshold with Semmes Wienstein monofilament intact to all pedal sites bilateral. Negative tinel sign.   Musculoskeletal: Tenderness to palpation along the plantar medial tubercle of the calcaneus at the insertion of plantar fascia on the right foot. There is no pain along the course of the plantar fascia within the arch of the foot. Plantar fascia appears to be intact. There is no pain with lateral compression of the calcaneus or pain with vibratory sensation. There is no pain along the course or insertion of the achilles tendon. No other areas of tenderness to bilateral lower extremities. Muscular strength 5/5 in all groups tested bilateral.  Gait: Unassisted, Nonantalgic.       Assessment:   Right heel pain, plantar fasciitis     Plan:  -Treatment options discussed including all alternatives, risks, and complications -Etiology of symptoms were discussed -X-rays were obtained and reviewed with the patient. No evidence of acute fracture or stress fracture.  -Steroid injection- see procedure note below -Plantar fascial brace. -Stretching, icing daily. -Discussed shoe modifications and orthotics.  Procedure: Injection Tendon/Ligament Discussed alternatives, risks, complications and verbal consent was obtained.  Location: Right plantar fascia at the glabrous junction; medial approach. Skin Prep: Alcohol  Injectate: 0.5cc 0.5% marcaine plain, 0.5 cc 2% lidocaine plain and, 1 cc kenalog 10. Disposition: Patient tolerated procedure well. Injection site dressed with a band-aid.  Post-injection  care was discussed and return precautions discussed.   Trula Slade DPM

## 2018-10-31 ENCOUNTER — Other Ambulatory Visit: Payer: Self-pay

## 2018-10-31 ENCOUNTER — Ambulatory Visit
Admission: RE | Admit: 2018-10-31 | Discharge: 2018-10-31 | Disposition: A | Discharge status: Home / Self Care | Payer: BC Managed Care – PPO | Source: Ambulatory Visit | Attending: Internal Medicine | Admitting: Internal Medicine

## 2018-10-31 DIAGNOSIS — Z1231 Encounter for screening mammogram for malignant neoplasm of breast: Secondary | ICD-10-CM | POA: Diagnosis not present

## 2018-11-15 ENCOUNTER — Ambulatory Visit (INDEPENDENT_AMBULATORY_CARE_PROVIDER_SITE_OTHER): Payer: BC Managed Care – PPO | Admitting: Podiatry

## 2018-11-15 ENCOUNTER — Other Ambulatory Visit: Payer: Self-pay | Admitting: Podiatry

## 2018-11-15 ENCOUNTER — Telehealth: Payer: Self-pay | Admitting: *Deleted

## 2018-11-15 ENCOUNTER — Other Ambulatory Visit: Payer: Self-pay

## 2018-11-15 DIAGNOSIS — M722 Plantar fascial fibromatosis: Secondary | ICD-10-CM

## 2018-11-15 DIAGNOSIS — M79671 Pain in right foot: Secondary | ICD-10-CM

## 2018-11-15 NOTE — Telephone Encounter (Signed)
Pharmacy sent a refill request over for Mobic and I called and spoke with patient and refilled 30 tablets and no refills and per Dr Jacqualyn Posey was ok to send over. Brittany Riley

## 2018-11-16 NOTE — Progress Notes (Signed)
Subjective: 63 year old female presents the office today for follow-up evaluation of right heel pain.  She states that it was doing good when she was on the meloxicam however when she finished this the pain started to come back.  She points more to the bottom, lateral aspect where she gets the discomfort now.  Injection also helped.  She has pain more in the morning.  She states that she tries to wear supportive shoes except when she is off the yard doing work and this is when she notices some pain as well. Denies any systemic complaints such as fevers, chills, nausea, vomiting. No acute changes since last appointment, and no other complaints at this time.   Objective: AAO x3, NAD DP/PT pulses palpable bilaterally, CRT less than 3 seconds There is tenderness palpation on the plantar aspect of the right heel mostly in the plantar lateral aspect.  There is no pain lateral compression of calcaneus.  No pain in the course of the Achilles tendon.  No other areas of tenderness.  Negative heel sign. No open lesions or pre-ulcerative lesions.  No pain with calf compression, swelling, warmth, erythema  Assessment: Right heel pain, plantar fascial  Plan: -All treatment options discussed with the patient including all alternatives, risks, complications.  -Steroid injection performed.  See procedure note below.  Discussed changing shoes particularly when she is doing yard work and wearing more supportive shoes.  Continue stretching, icing daily. -Patient encouraged to call the office with any questions, concerns, change in symptoms.   Procedure: Injection Tendon/Ligament Discussed alternatives, risks, complications and verbal consent was obtained.  Location: RIGHT  plantar fascia at the glabrous junction; lateral approach. Skin Prep: Alcohol  Injectate: 0.5cc 0.5% marcaine plain, 0.5 cc 2% lidocaine plain and, 1 cc kenalog 10. Disposition: Patient tolerated procedure well. Injection site dressed with a  band-aid.  Post-injection care was discussed and return precautions discussed.    Trula Slade DPM

## 2018-11-29 DIAGNOSIS — B351 Tinea unguium: Secondary | ICD-10-CM | POA: Diagnosis not present

## 2018-11-29 DIAGNOSIS — I10 Essential (primary) hypertension: Secondary | ICD-10-CM | POA: Diagnosis not present

## 2018-12-08 ENCOUNTER — Other Ambulatory Visit: Payer: Self-pay | Admitting: Podiatry

## 2018-12-10 ENCOUNTER — Telehealth: Payer: Self-pay | Admitting: *Deleted

## 2018-12-10 NOTE — Telephone Encounter (Signed)
Called and left a message for the patient to call me back regarding a refill of meloxicam. Lattie Haw

## 2018-12-12 ENCOUNTER — Telehealth: Payer: Self-pay | Admitting: *Deleted

## 2018-12-12 NOTE — Telephone Encounter (Signed)
Per Dr Jacqualyn Posey ok to refill the meloxicam. Lattie Haw

## 2019-05-24 DIAGNOSIS — H524 Presbyopia: Secondary | ICD-10-CM | POA: Diagnosis not present

## 2019-05-28 DIAGNOSIS — Z20828 Contact with and (suspected) exposure to other viral communicable diseases: Secondary | ICD-10-CM | POA: Diagnosis not present

## 2019-05-28 DIAGNOSIS — Z0000 Encounter for general adult medical examination without abnormal findings: Secondary | ICD-10-CM | POA: Diagnosis not present

## 2019-05-28 DIAGNOSIS — E7849 Other hyperlipidemia: Secondary | ICD-10-CM | POA: Diagnosis not present

## 2019-05-28 DIAGNOSIS — E038 Other specified hypothyroidism: Secondary | ICD-10-CM | POA: Diagnosis not present

## 2019-05-31 DIAGNOSIS — R82998 Other abnormal findings in urine: Secondary | ICD-10-CM | POA: Diagnosis not present

## 2019-05-31 DIAGNOSIS — E7849 Other hyperlipidemia: Secondary | ICD-10-CM | POA: Diagnosis not present

## 2019-06-04 DIAGNOSIS — Z0000 Encounter for general adult medical examination without abnormal findings: Secondary | ICD-10-CM | POA: Diagnosis not present

## 2019-06-04 DIAGNOSIS — Z1331 Encounter for screening for depression: Secondary | ICD-10-CM | POA: Diagnosis not present

## 2019-06-04 DIAGNOSIS — M25561 Pain in right knee: Secondary | ICD-10-CM | POA: Diagnosis not present

## 2019-06-04 DIAGNOSIS — E785 Hyperlipidemia, unspecified: Secondary | ICD-10-CM | POA: Diagnosis not present

## 2019-06-04 DIAGNOSIS — I10 Essential (primary) hypertension: Secondary | ICD-10-CM | POA: Diagnosis not present

## 2019-06-10 DIAGNOSIS — Z1211 Encounter for screening for malignant neoplasm of colon: Secondary | ICD-10-CM | POA: Diagnosis not present

## 2019-07-02 DIAGNOSIS — D122 Benign neoplasm of ascending colon: Secondary | ICD-10-CM | POA: Diagnosis not present

## 2019-07-02 DIAGNOSIS — D123 Benign neoplasm of transverse colon: Secondary | ICD-10-CM | POA: Diagnosis not present

## 2019-07-02 DIAGNOSIS — K635 Polyp of colon: Secondary | ICD-10-CM | POA: Diagnosis not present

## 2019-07-02 DIAGNOSIS — Z1211 Encounter for screening for malignant neoplasm of colon: Secondary | ICD-10-CM | POA: Diagnosis not present

## 2019-09-03 DIAGNOSIS — E039 Hypothyroidism, unspecified: Secondary | ICD-10-CM | POA: Diagnosis not present

## 2019-09-03 DIAGNOSIS — I10 Essential (primary) hypertension: Secondary | ICD-10-CM | POA: Diagnosis not present

## 2019-09-03 DIAGNOSIS — F5104 Psychophysiologic insomnia: Secondary | ICD-10-CM | POA: Diagnosis not present

## 2019-09-26 ENCOUNTER — Ambulatory Visit (INDEPENDENT_AMBULATORY_CARE_PROVIDER_SITE_OTHER): Payer: BLUE CROSS/BLUE SHIELD | Admitting: Neurology

## 2019-09-26 ENCOUNTER — Encounter: Payer: Self-pay | Admitting: Neurology

## 2019-09-26 ENCOUNTER — Other Ambulatory Visit: Payer: Self-pay

## 2019-09-26 VITALS — BP 130/92 | HR 85 | Ht 66.0 in | Wt 307.3 lb

## 2019-09-26 DIAGNOSIS — Z6841 Body Mass Index (BMI) 40.0 and over, adult: Secondary | ICD-10-CM

## 2019-09-26 DIAGNOSIS — Z87448 Personal history of other diseases of urinary system: Secondary | ICD-10-CM | POA: Diagnosis not present

## 2019-09-26 DIAGNOSIS — R519 Headache, unspecified: Secondary | ICD-10-CM

## 2019-09-26 DIAGNOSIS — R0681 Apnea, not elsewhere classified: Secondary | ICD-10-CM

## 2019-09-26 DIAGNOSIS — R351 Nocturia: Secondary | ICD-10-CM | POA: Diagnosis not present

## 2019-09-26 DIAGNOSIS — Z9189 Other specified personal risk factors, not elsewhere classified: Secondary | ICD-10-CM

## 2019-09-26 DIAGNOSIS — G479 Sleep disorder, unspecified: Secondary | ICD-10-CM

## 2019-09-26 DIAGNOSIS — R0683 Snoring: Secondary | ICD-10-CM | POA: Diagnosis not present

## 2019-09-26 DIAGNOSIS — G4700 Insomnia, unspecified: Secondary | ICD-10-CM

## 2019-09-26 NOTE — Progress Notes (Signed)
Subjective:    Patient ID: Brittany Riley is a 64 y.o. female.  HPI     Brittany Foley, MD, PhD [facility] [address] [address] [place], [place] [address]  Dear Dr. Jarold Motto,  I saw your patient, Brittany Riley, upon your kind request in my sleep clinic today for initial consultation of her sleep disorder, in particular, chronic difficulty initiating maintaining sleep, concern for underlying obstructive sleep apnea.  The patient is unaccompanied today.  As you know, Brittany Riley is a 64 year old right-handed woman with an underlying medical history of hypertension, hypothyroidism, hyperlipidemia, history of Bell's palsy in 2016, for which I saw her some 4 years ago, and morbid obesity with a BMI of over 45, who reports snoring and nonrestorative sleep, difficulty falling asleep.  She has significant nocturia about 3 times per average night and has had rare morning headaches, has also experienced on a rare occasion nocturnal enuresis.  She goes to bed around 930 but likes to read in bed, she may fall asleep around 11 PM.  She reports sinus congestion at night and some postnasal drip but no history of allergies per se.  She wakes up with a dry mouth.  She has no family history of sleep apnea.  She lives with her husband, no pets in the house.  She has had witnessed apneas per husband's report.  Her Epworth sleepiness score is 2 out of 24, fatigue severity score is 18 out of 63.  She does not currently work. I reviewed your office note from 09/03/2019.  She has had weight gain in the recent past.  She was recently started on carvedilol.  Previously:  05/14/2015: 64 year old right-handed woman with an underlying medical history of hypertension, hyperlipidemia, hypothyroidism, and morbid obesity, who reports new onset left facial weakness and left facial twitching. Her symptoms started in September 2016 when she first noticed intermittent twitching of her left face.  Around Thanksgiving 2016 she noticed facial weakness. Since then, she has improved some with regards to her facial weakness. Her facial twitching is not always the same. It comes and goes. It is somewhat bothersome to her but not all the time. She has no pain. She had no prodromal illness as far she recalls. She had no other weakness or numbness or tingling elsewhere in her body. She had no headache and no ear pain, no hearing loss or exaggerated sound sensitivity. Overall, she feels quite well. She also recalls no additional stressors or problems at the time. She feels that she sleeps fairly well but she does snore some. She is motivated to lose weight. She denies any gasping sensations, morning headaches but does have occasional nocturia.   You ordered a brain MRI. She had a brain MRI without contrast on 05/12/2015: IMPRESSION: Normal for age noncontrast MRI appearance of the brain.   In addition, I personally reviewed the images through the PACS system.    I reviewed your office note from 04/22/2015, which you kindly included.  Her Past Medical History Is Significant For: Past Medical History:  Diagnosis Date  . Chronic insomnia   . Dyslipidemia   . Facial droop   . Hemorrhoids   . Hypertension   . Hypothyroidism   . Microscopic hematuria     Her Past Surgical History Is Significant For: Past Surgical History:  Procedure Laterality Date  . CESAREAN SECTION    . CHOLECYSTECTOMY    . CYSTOSCOPY    . LAPAROSCOPIC CHOLECYSTECTOMY WITH COMMON DUCT  EXPLORATION AND INTRAOP CHOLANGIOGRAM      Her Family History Is Significant For: Family History  Problem Relation Age of Onset  . Hypertension Mother   . COPD Mother   . Diabetes Father   . Coronary artery disease Father   . Hypertension Sister   . Hodgkin's lymphoma Brother   . Breast cancer Neg Hx     Her Social History Is Significant For: Social History   Socioeconomic History  . Marital status: Married    Spouse name: Not  on file  . Number of children: 2  . Years of education: College  . Highest education level: Not on file  Occupational History  . Occupation: None   Tobacco Use  . Smoking status: Never Smoker  . Smokeless tobacco: Never Used  Substance and Sexual Activity  . Alcohol use: Yes    Alcohol/week: 0.0 standard drinks    Comment: Rare  . Drug use: No  . Sexual activity: Not on file  Other Topics Concern  . Not on file  Social History Narrative   Drinks 2 diet sodas a day    Social Determinants of Health   Financial Resource Strain:   . Difficulty of Paying Living Expenses:   Food Insecurity:   . Worried About Programme researcher, broadcasting/film/video in the Last Year:   . Barista in the Last Year:   Transportation Needs:   . Freight forwarder (Medical):   Marland Kitchen Lack of Transportation (Non-Medical):   Physical Activity:   . Days of Exercise per Week:   . Minutes of Exercise per Session:   Stress:   . Feeling of Stress :   Social Connections:   . Frequency of Communication with Friends and Family:   . Frequency of Social Gatherings with Friends and Family:   . Attends Religious Services:   . Active Member of Clubs or Organizations:   . Attends Banker Meetings:   Marland Kitchen Marital Status:     Her Allergies Are:  Allergies  Allergen Reactions  . Ampicillin   . Penicillins Rash  :   Her Current Medications Are:  Outpatient Encounter Medications as of 09/26/2019  Medication Sig  . amLODipine (NORVASC) 5 MG tablet Take 5 mg by mouth daily.  . carvedilol (COREG) 3.125 MG tablet Take 3.125 mg by mouth 2 (two) times daily.  . hydrochlorothiazide (MICROZIDE) 12.5 MG capsule Take 12.5 mg by mouth daily.  Marland Kitchen levothyroxine (SYNTHROID) 100 MCG tablet Take 100 mcg by mouth daily.  Marland Kitchen losartan (COZAAR) 100 MG tablet Take 100 mg by mouth daily.  . [DISCONTINUED] levothyroxine (SYNTHROID, LEVOTHROID) 75 MCG tablet Take 75 mcg by mouth daily before breakfast.  . [DISCONTINUED] meloxicam  (MOBIC) 15 MG tablet TAKE 1 TABLET BY MOUTH EVERY DAY   No facility-administered encounter medications on file as of 09/26/2019.  :  Review of Systems:  Out of a complete 14 point review of systems, all are reviewed and negative with the exception of these symptoms as listed below: Review of Systems  Neurological:       Here for sleep consult. No prior sleep study. Pt reports snoring is present.  Epworth Sleepiness Scale 0= would never doze 1= slight chance of dozing 2= moderate chance of dozing 3= high chance of dozing  Sitting and reading:0 Watching TV:0 Sitting inactive in a public place (ex. Theater or meeting):0 As a passenger in a car for an hour without a break:1 Lying down to rest in the  afternoon:1 Sitting and talking to someone:0 Sitting quietly after lunch (no alcohol):0 In a car, while stopped in traffic:0 Total:2     Objective:  Neurological Exam  Physical Exam Physical Examination:   Vitals:   09/26/19 0858  BP: (!) 130/92  Pulse: 85  SpO2: 95%   General Examination: The patient is a very pleasant 64 y.o. female in no acute distress. She appears well-developed and well-nourished and well groomed.   HEENT: Normocephalic, atraumatic, pupils are equal, round and reactive to light, extraocular tracking is good without limitation to gaze excursion or nystagmus noted. Hearing is grossly intact. Face is mildly asymmetric with mild left lower facial weakness noted and intermittent mild twitching around the left eye and midface area.  Speech is clear, airway examination reveals a small airway entry and small mouth opening, tonsils not fully visualized, uvula more prominent, Mallampati class III, neck circumference of 17-1/2 inches.  She has a moderate overbite.  Tongue protrudes centrally and palate elevates symmetrically.  There are no carotid bruits on auscultation.   Chest: Clear to auscultation without wheezing, rhonchi or crackles noted.  Heart: S1+S2+0, regular  and normal without murmurs, rubs or gallops noted.   Abdomen: Soft, non-tender and non-distended with normal bowel sounds appreciated on auscultation.  Extremities: There is trace pitting edema in the distal lower extremities bilaterally.   Skin: Warm and dry without trophic changes noted.   Musculoskeletal: exam reveals no obvious joint deformities, tenderness or joint swelling or erythema.   Neurologically:  Mental status: The patient is awake, alert and oriented in all 4 spheres. Her immediate and remote memory, attention, language skills and fund of knowledge are appropriate. There is no evidence of aphasia, agnosia, apraxia or anomia. Speech is clear with normal prosody and enunciation. Thought process is linear. Mood is normal and affect is normal.  Cranial nerves II - XII are as described above under HEENT exam.  Motor exam: Normal bulk, strength and tone is noted. There is no tremor, fine motor skills and coordination: grossly intact.  Cerebellar testing: No dysmetria or intention tremor. There is no truncal or gait ataxia.  Sensory exam: intact to light touch in the upper and lower extremities.  Gait, station and balance: She stands easily. No veering to one side is noted. No leaning to one side is noted. Posture is age-appropriate and stance is narrow based. Gait shows normal stride length and normal pace. No problems turning are noted.   Assessment and plan:   In summary, Brittany Riley is a very pleasant 64 y.o.-year old female  with an underlying medical history of hypertension, hypothyroidism, hyperlipidemia, history of Bell's palsy in 2016, for which I saw her some 4 years ago, and morbid obesity with a BMI of over 45, whose history and physical exam are concerning for obstructive sleep apnea (OSA). I had a long chat with the patient about my findings and the diagnosis of OSA, its prognosis and treatment options. We talked about medical treatments, surgical interventions and  non-pharmacological approaches. I explained in particular the risks and ramifications of untreated moderate to severe OSA, especially with respect to developing cardiovascular disease down the Road, including congestive heart failure, difficult to treat hypertension, cardiac arrhythmias, or stroke. Even type 2 diabetes has, in part, been linked to untreated OSA. Symptoms of untreated OSA include daytime sleepiness, memory problems, mood irritability and mood disorder such as depression and anxiety, lack of energy, as well as recurrent headaches, especially morning headaches. We talked about trying  to maintain a healthy lifestyle in general, as well as the importance of weight control. We also talked about the importance of good sleep hygiene. I recommended the following at this time: sleep study.  She is somewhat apprehensive about sleep testing.  She is worried that she would not be able to sleep enough.  I tried to reassure her. I explained the sleep test procedure to the patient and also outlined possible surgical and non-surgical treatment options of OSA, including the use of a custom-made dental device (which would require a referral to a specialist dentist or oral surgeon), upper airway surgical options (which would involve a referral to an ENT surgeon). I also explained the CPAP treatment option to the patient, who indicated that she would be willing to try CPAP if the need arises. I explained the importance of being compliant with PAP treatment, not only for insurance purposes but primarily to improve Her symptoms, and for the patient's long term health benefit, including to reduce Her cardiovascular risks. I answered all her questions today and the patient was in agreement. I plan to see her back after the sleep study is completed and encouraged her to call with any interim questions, concerns, problems or updates.   Thank you very much for allowing me to participate in the care of this nice patient.  If I can be of any further assistance to you please do not hesitate to call me at [phone].  Sincerely,   Star Age, MD, PhD

## 2019-09-26 NOTE — Patient Instructions (Addendum)
Thank you for choosing [facility] for your sleep related care! It was nice to meet you today! I appreciate that you entrust me with your sleep related healthcare concerns. I hope, I was able to address at least some of your concerns today, and that I can help you feel reassured and also get better.    Here is what we discussed today and what we came up with as our plan for you:    Based on your symptoms and your exam I believe you are at risk for obstructive sleep apnea (aka OSA), and I think we should proceed with a sleep study to determine whether you do or do not have OSA and how severe it is. Even, if you have mild OSA, I may want you to consider treatment with CPAP, as treatment of even borderline or mild sleep apnea can result and improvement of symptoms such as sleep disruption, daytime sleepiness, nighttime bathroom breaks, restless leg symptoms, improvement of headache syndromes, even improved mood disorder.   Please remember, the long-term risks and ramifications of untreated moderate to severe obstructive sleep apnea are: increased Cardiovascular disease, including congestive heart failure, stroke, difficult to control hypertension, treatment resistant obesity, arrhythmias, especially irregular heartbeat commonly known as A. Fib. (atrial fibrillation); even type 2 diabetes has been linked to untreated OSA.   Sleep apnea can cause disruption of sleep and sleep deprivation in most cases, which, in turn, can cause recurrent headaches, problems with memory, mood, concentration, focus, and vigilance. Most people with untreated sleep apnea report excessive daytime sleepiness, which can affect their ability to drive. Please do not drive if you feel sleepy. Patients with sleep apnea developed difficulty initiating and maintaining sleep (aka insomnia).   Having sleep apnea may increase your risk for other sleep disorders, including involuntary behaviors sleep such as sleep terrors,  sleep talking, sleepwalking.    Having sleep apnea can also increase your risk for restless leg syndrome and leg movements at night.   Please note that untreated obstructive sleep apnea may carry additional perioperative morbidity. Patients with significant obstructive sleep apnea (typically, in the moderate to severe degree) should receive, if possible, perioperative PAP (positive airway pressure) therapy and the surgeons and particularly the anesthesiologists should be informed of the diagnosis and the severity of the sleep disordered breathing.   I will likely see you back after your sleep study to go over the test results and where to go from there. We will call you after your sleep study to advise about the results (most likely, you will hear from Megan, my nurse) and to set up an appointment at the time, as necessary.    Our sleep lab administrative assistant will call you to schedule your sleep study and give you further instructions, regarding the check in process for the sleep study, arrival time, what to bring, when you can expect to leave after the study, etc., and to answer any other logistical questions you may have. If you don't hear back from her by about 2 weeks from now, please feel free to call her direct line at [phone] or you can call our general clinic number, or email us through My Chart.   

## 2019-10-18 DIAGNOSIS — Z01419 Encounter for gynecological examination (general) (routine) without abnormal findings: Secondary | ICD-10-CM | POA: Diagnosis not present

## 2019-10-18 DIAGNOSIS — Z6841 Body Mass Index (BMI) 40.0 and over, adult: Secondary | ICD-10-CM | POA: Diagnosis not present

## 2019-10-22 ENCOUNTER — Other Ambulatory Visit: Payer: Self-pay | Admitting: Internal Medicine

## 2019-10-22 DIAGNOSIS — Z1231 Encounter for screening mammogram for malignant neoplasm of breast: Secondary | ICD-10-CM

## 2019-11-07 ENCOUNTER — Ambulatory Visit
Admission: RE | Admit: 2019-11-07 | Discharge: 2019-11-07 | Disposition: A | Discharge status: Home / Self Care | Payer: BLUE CROSS/BLUE SHIELD | Source: Ambulatory Visit | Attending: Internal Medicine | Admitting: Internal Medicine

## 2019-11-07 ENCOUNTER — Other Ambulatory Visit: Payer: Self-pay

## 2019-11-07 DIAGNOSIS — Z1231 Encounter for screening mammogram for malignant neoplasm of breast: Secondary | ICD-10-CM

## 2019-11-13 ENCOUNTER — Ambulatory Visit (INDEPENDENT_AMBULATORY_CARE_PROVIDER_SITE_OTHER): Payer: BLUE CROSS/BLUE SHIELD | Admitting: Neurology

## 2019-11-13 ENCOUNTER — Other Ambulatory Visit: Payer: Self-pay

## 2019-11-13 DIAGNOSIS — G4700 Insomnia, unspecified: Secondary | ICD-10-CM

## 2019-11-13 DIAGNOSIS — R351 Nocturia: Secondary | ICD-10-CM

## 2019-11-13 DIAGNOSIS — Z87448 Personal history of other diseases of urinary system: Secondary | ICD-10-CM

## 2019-11-13 DIAGNOSIS — G4733 Obstructive sleep apnea (adult) (pediatric): Secondary | ICD-10-CM

## 2019-11-13 DIAGNOSIS — R0683 Snoring: Secondary | ICD-10-CM

## 2019-11-13 DIAGNOSIS — Z9189 Other specified personal risk factors, not elsewhere classified: Secondary | ICD-10-CM

## 2019-11-13 DIAGNOSIS — R519 Headache, unspecified: Secondary | ICD-10-CM

## 2019-11-13 DIAGNOSIS — Z6841 Body Mass Index (BMI) 40.0 and over, adult: Secondary | ICD-10-CM

## 2019-11-13 DIAGNOSIS — R0681 Apnea, not elsewhere classified: Secondary | ICD-10-CM

## 2019-11-19 ENCOUNTER — Telehealth: Payer: Self-pay

## 2019-11-19 NOTE — Addendum Note (Signed)
Addended by: [name] on: 11/19/2019 07:40 AM   Modules accepted: Orders

## 2019-11-19 NOTE — Procedures (Signed)
Patient Information     First Name: Brittany Last Name: Riley ID: [id number]  Birth Date: 2055/11/20 Age: 64 Gender: Female  Referring Provider: Dr. Eloise Harman BMI: 49.2 (W=306 lb, H=5' 6'')  Neck Circ.:  17 '' Epworth:  2/24   Sleep Study Information    Study Date: 11/13/19 S/H/A Version: [id number] / 4.1.1528 / 57  History:    64 year old woman with a history of hypertension, hypothyroidism, hyperlipidemia, history of Bell's palsy in 2016, for which I saw her some 4 years ago, and morbid obesity with a BMI of over 45, who reports snoring and nonrestorative sleep, difficulty falling asleep.  She has significant nocturia about 3 times per average night and has had rare morning headaches. Summary & Diagnosis:     Severe OSA Recommendations:     This home sleep test demonstrates severe obstructive sleep apnea with a total AHI of 84.1/hour and O2 nadir of 73%. Snoring was noted to be moderate to loud. Treatment with positive airway pressure (in the form of CPAP) is recommended. This will require - ideally - a full night CPAP titration study for proper treatment settings, O2 monitoring and mask fitting. Based on the severity of the sleep disordered breathing an attended titration study is indicated. However, patient's insurance has denied an attended sleep study; therefore, the patient will be advised to proceed with an autoPAP titration/trial at home for now. Please note that untreated obstructive sleep apnea may carry additional perioperative morbidity. Patients with significant obstructive sleep apnea should receive perioperative PAP therapy and the surgeons and particularly the anesthesiologist should be informed of the diagnosis and the severity of the sleep disordered breathing. The patient should be cautioned not to drive, work at heights, or operate dangerous or heavy equipment when tired or sleepy. Review and reiteration of good sleep hygiene measures should be pursued with any patient. Other  causes of the patient's symptoms, including circadian rhythm disturbances, an underlying mood disorder, medication effect and/or an underlying medical problem cannot be ruled out based on this test. Clinical correlation is recommended. The patient and his referring provider will be notified of the test results. The patient will be seen in follow up in sleep clinic at [facility].  I certify that I have reviewed the raw data recording prior to the issuance of this report in accordance with the standards of the American Academy of Sleep Medicine (AASM).  [name], MD, PhD [facility]) Diplomat, ABPN (Neurology and Sleep)            Sleep Summary  Oxygen Saturation Statistics   Start Study Time: End Study Time: Total Recording Time:        10:53:51 PM       6:51:05 AM 7 h, 57 min  Total Sleep Time % REM of Sleep Time:  7 h, 27 min  20.5    Mean: 92 Minimum: 73 Maximum: 98  Mean of Desaturations Nadirs (%):   89  Oxygen Desaturation. %:   4-9 10-20 >20 Total  Events Number Total   410  40 90.9 8.9  1 0.2  451 100.0  Oxygen Saturation: <90 <=88 <85 <80 <70  Duration (minutes): Sleep % 38.2 8.5 29.6 17.2 6.6 3.8 2.9 0.7 0.0 0.0     Respiratory Indices      Total Events REM NREM All Night  pRDI:  622  pAHI:  621 ODI:  451  pAHIc:  78  % CSR: 13.2 68.3 67.6 61.7 13.1 88.4 88.4  60.9 9.9 84.3 84.1 61.1 10.6       Pulse Rate Statistics during Sleep (BPM)      Mean: 72 Minimum: 50 Maximum: 101    Indices are calculated using technically valid sleep time of 7 h, 22 min. pRDI/pAHI are calculated using oxi desaturations ? 3%  Body Position Statistics  Position Supine Prone Right Left Non-Supine  Sleep (min) 447.2 0.0 0.5 0.0 0.5  Sleep % 99.9 0.0 0.1 0.0 0.1  pRDI 84.3 N/A N/A N/A N/A  pAHI 84.1 N/A N/A N/A N/A  ODI 61.1 N/A N/A N/A N/A     Snoring Statistics Snoring Level (dB) >40 >50 >60 >70 >80 >Threshold (45)  Sleep (min) 379.4  246.2 174.8 0.0 0.0 273.3  Sleep % 84.7 55.0 39.0 0.0 0.0 61.0    Mean: 54 dB Sleep Stages Chart                                                                             pAHI=84.1                                                                                              Mild              Moderate                    Severe                                                 5              15                    30

## 2019-11-19 NOTE — Progress Notes (Signed)
Patient referred by Dr. Eloise Harman for sleep disturbance, seen by me on 09/26/19, HST on 11/13/19.    Please call and notify the patient that the recent home sleep test showed obstructive sleep apnea in the severe range. While I recommend treatment for this in the form CPAP, her insurance will not approve a sleep study for this. They will likely only approve a trial of autoPAP, which means, that we don't have to bring her in for a sleep study with CPAP, but will let her start using an autoPAP machine at home, through a DME company (of her choice, or as per insurance requirement). As I recall, she was apprehensive about coming in for sleep testing in the first place, so starting treatment at home with autoPAP may be a good choice for her at this juncture.  The DME representative will educate her on how to use the machine, how to put the mask on, etc. I have placed an order in the chart. Please send referral, talk to patient, send report to referring MD. We will need a FU in sleep clinic for 10 weeks post-PAP set up, please arrange that with me or one of our NPs. Thanks,   [name], MD, PhD [facility])

## 2019-11-19 NOTE — Telephone Encounter (Signed)
-----   Message from [name], MD sent at 11/19/2019  7:40 AM EDT ----- Patient referred by Dr. Eloise Harman for sleep disturbance, seen by me on 09/26/19, HST on 11/13/19.    Please call and notify the patient that the recent home sleep test showed obstructive sleep apnea in the severe range. While I recommend treatment for this in the form CPAP, her insurance will not approve a sleep study for this. They will likely only approve a trial of autoPAP, which means, that we don't have to bring her in for a sleep study with CPAP, but will let her start using an autoPAP machine at home, through a DME company (of her choice, or as per insurance requirement). As I recall, she was apprehensive about coming in for sleep testing in the first place, so starting treatment at home with autoPAP may be a good choice for her at this juncture.  The DME representative will educate her on how to use the machine, how to put the mask on, etc. I have placed an order in the chart. Please send referral, talk to patient, send report to referring MD. We will need a FU in sleep clinic for 10 weeks post-PAP set up, please arrange that with me or one of our NPs. Thanks,   [name], MD, PhD [facility])

## 2019-11-19 NOTE — Telephone Encounter (Signed)
I called pt. No answer, left a message asking pt to call me back.   

## 2019-11-20 NOTE — Telephone Encounter (Signed)
I called Brittany Riley. I advised Brittany Riley that Dr. Frances Furbish reviewed their sleep study results and found that Brittany Riley has severe osa. Dr. Frances Furbish recommends that Brittany Riley start autopap therapy in the home for treatment. I reviewed PAP compliance expectations with the Brittany Riley. Brittany Riley is agreeable to starting an auto-PAP. I advised Brittany Riley that an order will be sent to a DME, [facility], and [facility] will call the Brittany Riley within about one week after they file with the Brittany Riley's insurance. [facility] will show the Brittany Riley how to use the machine, fit for masks, and troubleshoot the auto-PAP if needed. A follow up appt was made for insurance purposes with Dr. Frances Furbish on 01/28/2020 at 930 am. Brittany Riley verbalized understanding to arrive 15 minutes early and bring their auto-PAP. A letter with all of this information in it will be mailed to the Brittany Riley as a reminder. I verified with the Brittany Riley that the address we have on file is correct. Brittany Riley verbalized understanding of results. Brittany Riley had no questions at this time but was encouraged to call back if questions arise. I have sent the order to [facility] and have received confirmation that they have received the order.

## 2019-12-27 DIAGNOSIS — G4733 Obstructive sleep apnea (adult) (pediatric): Secondary | ICD-10-CM | POA: Diagnosis not present

## 2020-01-27 DIAGNOSIS — G4733 Obstructive sleep apnea (adult) (pediatric): Secondary | ICD-10-CM | POA: Diagnosis not present

## 2020-01-28 ENCOUNTER — Encounter: Payer: Self-pay | Admitting: Neurology

## 2020-01-28 ENCOUNTER — Ambulatory Visit (INDEPENDENT_AMBULATORY_CARE_PROVIDER_SITE_OTHER): Payer: BLUE CROSS/BLUE SHIELD | Admitting: Neurology

## 2020-01-28 VITALS — BP 126/82 | HR 86 | Ht 66.0 in | Wt 307.0 lb

## 2020-01-28 DIAGNOSIS — G4734 Idiopathic sleep related nonobstructive alveolar hypoventilation: Secondary | ICD-10-CM

## 2020-01-28 DIAGNOSIS — Z9989 Dependence on other enabling machines and devices: Secondary | ICD-10-CM | POA: Diagnosis not present

## 2020-01-28 DIAGNOSIS — Z789 Other specified health status: Secondary | ICD-10-CM

## 2020-01-28 DIAGNOSIS — G4733 Obstructive sleep apnea (adult) (pediatric): Secondary | ICD-10-CM

## 2020-01-28 NOTE — Patient Instructions (Signed)
Please continue using your autoPAP regularly. While your insurance requires that you use PAP at least 4 hours each night on 70% of the nights, I recommend, that you not skip any nights and use it throughout the night if you can. Getting used to PAP and staying with the treatment long term does take time and patience and discipline. Untreated obstructive sleep apnea when it is moderate to severe can have an adverse impact on cardiovascular health and raise her risk for heart disease, arrhythmias, hypertension, congestive heart failure, stroke and diabetes. Untreated obstructive sleep apnea causes sleep disruption, nonrestorative sleep, and sleep deprivation. This can have an impact on your day to day functioning and cause daytime sleepiness and impairment of cognitive function, memory loss, mood disturbance, and problems focussing. Using PAP regularly can improve these symptoms.  You have been fully compliant with treatment. It may take a little while, sometimes up to 3 to 6 months to reach full benefit from treatment of sleep apnea. Please hang in there. In the meantime, as discussed, we will change her pressure setting to 11 cm set pressure which is called CPAP. Please call us in 6 weeks so we can review another download. In addition, I would like to do an overnight oxygen level test, called ONO, and your DME company will call and set this up for one night, while you also use your autoPAP as usual. We will call you with the results. This is to make sure that your oxygen levels stay in the 90s, while you are treated with autoPAP for your OSA. Remember, your oxygen levels dropped into the low 70s during the home sleep test.   Please follow-up routinely to see one of our nurse practitioners in 6 months. Call us with any interim questions or concerns.

## 2020-01-28 NOTE — Progress Notes (Signed)
Subjective:    Patient ID: Brittany Riley is a 64 y.o. female.  HPI     Interim history:   Brittany Riley is a 64 year old right-handed woman with an underlying medical history of hypertension, hypothyroidism, hyperlipidemia, history of Bell's palsy in 2016, for which I saw her some 4 years ago, and morbid obesity with a BMI of over 45, who Presents for follow-up consultation of her obstructive sleep apnea after sleep testing and starting AutoPap therapy.  The patient is unaccompanied today.  I saw her for sleep evaluation on Sep 26, 2019 at the request of her primary care physician, at which time she reported significant nocturia and morning headaches as well as nonrestorative sleep and snoring.  She was advised to proceed with sleep testing.  She had a home sleep test on November 13, 2019 which indicated severe obstructive sleep apnea with an AHI of 84.1/h, O2 nadir of 73%.  She was advised to start home AutoPap therapy.  Her set up date was December 27, 2019.  Today, January 28, 2020: I reviewed her AutoPap compliance data from 12/28/2019 through 01/26/2020, which is a total of 30 days, during which time she used her machine every night with percent use days greater than 4 h at 100%, indicating superb compliance, with an average usage of 8 h and 0 min, residual AHI at goal at 1.2/h, 95th percentile of pressure at 12.5 cm with a range of 7 to 14 cm with EPR, leak in the acceptable range with the 95th percentile at 12.7 L/min.  She reports that she is struggling with tolerance. The mask has bothered her from the get go but her husband got her some foam for padding. She had pressure points in the back of her head and also on the left side of her forehead. She has not really felt any better as far as her sleep quality. She sleeps still an average of 5 to 6 hours. She is motivated to continue with treatment. She has been using a full facemask. She has gained a little bit of weight. She is typically a left side  sleeper. She does report that her nocturia is better. She has to get up at the most once per night when it used to be about 3 times per night on average. She reports that her mouth dryness is actually better.  The patient's allergies, current medications, family history, past medical history, past social history, past surgical history and problem list were reviewed and updated as appropriate.   Previously:  09/26/19: (She) reports snoring and nonrestorative sleep, difficulty falling asleep.  She has significant nocturia about 3 times per average night and has had rare morning headaches, has also experienced on a rare occasion nocturnal enuresis.  She goes to bed around 930 but likes to read in bed, she may fall asleep around 11 PM.  She reports sinus congestion at night and some postnasal drip but no history of allergies per se.  She wakes up with a dry mouth.  She has no family history of sleep apnea.  She lives with her husband, no pets in the house.  She has had witnessed apneas per husband's report.  Her Epworth sleepiness score is 2 out of 24, fatigue severity score is 18 out of 63.  She does not currently work. I reviewed your office note from 09/03/2019.  She has had weight gain in the recent past.  She was recently started on carvedilol.     05/14/2015: 64 year old right-handed  woman with an underlying medical history of hypertension, hyperlipidemia, hypothyroidism, and morbid obesity, who reports new onset left facial weakness and left facial twitching. Her symptoms started in September 2016 when she first noticed intermittent twitching of her left face. Around Thanksgiving 2016 she noticed facial weakness. Since then, she has improved some with regards to her facial weakness. Her facial twitching is not always the same. It comes and goes. It is somewhat bothersome to her but not all the time. She has no pain. She had no prodromal illness as far she recalls. She had no other weakness or numbness or  tingling elsewhere in her body. She had no headache and no ear pain, no hearing loss or exaggerated sound sensitivity. Overall, she feels quite well. She also recalls no additional stressors or problems at the time. She feels that she sleeps fairly well but she does snore some. She is motivated to lose weight. She denies any gasping sensations, morning headaches but does have occasional nocturia.   You ordered a brain MRI. She had a brain MRI without contrast on 05/12/2015: IMPRESSION: Normal for age noncontrast MRI appearance of the brain.   In addition, I personally reviewed the images through the PACS system.    I reviewed your office note from 04/22/2015, which you kindly included.   Her Past Medical History Is Significant For: Past Medical History:  Diagnosis Date  . Chronic insomnia   . Dyslipidemia   . Facial droop   . Hemorrhoids   . Hypertension   . Hypothyroidism   . Microscopic hematuria     Her Past Surgical History Is Significant For: Past Surgical History:  Procedure Laterality Date  . CESAREAN SECTION    . CHOLECYSTECTOMY    . CYSTOSCOPY    . LAPAROSCOPIC CHOLECYSTECTOMY WITH COMMON DUCT EXPLORATION AND INTRAOP CHOLANGIOGRAM      Her Family History Is Significant For: Family History  Problem Relation Age of Onset  . Hypertension Mother   . COPD Mother   . Diabetes Father   . Coronary artery disease Father   . Hypertension Sister   . Hodgkin's lymphoma Brother   . Breast cancer Neg Hx     Her Social History Is Significant For: Social History   Socioeconomic History  . Marital status: Married    Spouse name: Not on file  . Number of children: 2  . Years of education: College  . Highest education level: Not on file  Occupational History  . Occupation: None   Tobacco Use  . Smoking status: Never Smoker  . Smokeless tobacco: Never Used  Substance and Sexual Activity  . Alcohol use: Yes    Alcohol/week: 0.0 standard drinks    Comment: Rare  . Drug  use: No  . Sexual activity: Not on file  Other Topics Concern  . Not on file  Social History Narrative   Drinks 2 diet sodas a day    Social Determinants of Health   Financial Resource Strain:   . Difficulty of Paying Living Expenses: Not on file  Food Insecurity:   . Worried About Programme researcher, broadcasting/film/video in the Last Year: Not on file  . Ran Out of Food in the Last Year: Not on file  Transportation Needs:   . Lack of Transportation (Medical): Not on file  . Lack of Transportation (Non-Medical): Not on file  Physical Activity:   . Days of Exercise per Week: Not on file  . Minutes of Exercise per Session: Not on file  Stress:   . Feeling of Stress : Not on file  Social Connections:   . Frequency of Communication with Friends and Family: Not on file  . Frequency of Social Gatherings with Friends and Family: Not on file  . Attends Religious Services: Not on file  . Active Member of Clubs or Organizations: Not on file  . Attends Banker Meetings: Not on file  . Marital Status: Not on file    Her Allergies Are:  Allergies  Allergen Reactions  . Ampicillin   . Penicillins Rash  :   Her Current Medications Are:  Outpatient Encounter Medications as of 01/28/2020  Medication Sig  . amLODipine (NORVASC) 5 MG tablet Take 5 mg by mouth daily.  . carvedilol (COREG) 3.125 MG tablet Take 3.125 mg by mouth 2 (two) times daily.  . hydrochlorothiazide (MICROZIDE) 12.5 MG capsule Take 12.5 mg by mouth daily.  Marland Kitchen levothyroxine (SYNTHROID) 100 MCG tablet Take 100 mcg by mouth daily.  Marland Kitchen losartan (COZAAR) 100 MG tablet Take 100 mg by mouth daily.   No facility-administered encounter medications on file as of 01/28/2020.  :  Review of Systems:  Out of a complete 14 point review of systems, all are reviewed and negative with the exception of these symptoms as listed below: Review of Systems  Neurological:       Pt here to f/u on starting autopap. Pt reports she has been struggling  with adjusting to the machine. She reports she feels worse since starting.     Objective:  Neurological Exam  Physical Exam Physical Examination:   Vitals:   01/28/20 0913  BP: 126/82  Pulse: 86  SpO2: 98%    General Examination: The patient is a very pleasant 64 y.o. female in no acute distress. She appears well-developed and well-nourished and well groomed.   HEENT: Normocephalic, atraumatic, pupils are equal, round and reactive to light, extraocular tracking is good without limitation to gaze excursion or nystagmus noted. Hearing is grossly intact. Face is mildly asymmetric with mild left lower facial weakness noted and intermittent mild twitching around the left eye and midface area, stable. Speech is clear, airway examination reveals a small airway entry and small mouth opening. Tongue protrudes centrally and palate elevates symmetrically.   Chest: Clear to auscultation without wheezing, rhonchi or crackles noted.  Heart: S1+S2+0, regular and normal without murmurs, rubs or gallops noted.   Abdomen: Soft, non-tender and non-distended with normal bowel sounds appreciated on auscultation.  Extremities: There is trace to 1+ pitting edema in the distal lower extremities bilaterally.   Skin: Warm and dry without trophic changes noted.   Musculoskeletal: exam reveals no obvious joint deformities, tenderness or joint swelling or erythema.   Neurologically:  Mental status: The patient is awake, alert and oriented in all 4 spheres. Her immediate and remote memory, attention, language skills and fund of knowledge are appropriate. There is no evidence of aphasia, agnosia, apraxia or anomia. Speech is clear with normal prosody and enunciation. Thought process is linear. Mood is normal and affect is normal.  Cranial nerves II - XII are as described above under HEENT exam.  Motor exam: Normal bulk, strength and tone is noted. There is no tremor, fine motor skills and coordination:  grossly intact.  Cerebellar testing: No dysmetria or intention tremor. There is no truncal or gait ataxia.  Sensory exam: intact to light touch in the upper and lower extremities.  Gait, station and balance: She stands easily. No veering to one  side is noted. No leaning to one side is noted. Posture is age-appropriate and stance is narrow based. Gait shows normal stride length and normal pace. No problems turning are noted.   Assessment and plan:   In summary, Brittany Riley is a very pleasant 64 year old female  with an underlying medical history of hypertension, hypothyroidism, hyperlipidemia, history of Bell's palsy in 2016, for which I saw her some 4 years ago, and morbid obesity with a BMI of over 45, who presents for follow-up consultation of her obstructive sleep apnea, which was determined to be in the severe range by home sleep testing on 11/13/2019, AHI was 84.1/h, O2 nadir 73%.  She has established treatment at home with AutoPap therapy since 12/27/2019 and is fully compliant with treatment but has not noticed any significant improvement in her sleep quality and daytime symptoms but does endorse improvement in her nocturia and mouth dryness since starting AutoPap.  She is motivated to continue with treatment.  We talked at length about her sleep study severity and the importance of treating it and the importance of being fully compliant with therapy.  She is highly commended for her treatment adherence.  She is advised to continue to work on weight loss.  She is advised that we could seek a mask refit with a different style of mask if the need arises.  For now, I suggested we change her modality from AutoPap to a set pressure of 11 cm.  This way she may tolerate the pressure a little bit better at night.  In addition, I would like to pursue an overnight pulse oximetry test as her home sleep test showed desaturations into the 80s and 70s, nadir of 73%.  She is agreeable to doing this test  which we will request through her current DME company.  She is advised to call our office in about 6 weeks so we can review another download remotely.  She is encouraged to continue with treatment and also advised that sometimes it can take a few months to reap full benefit from positive airway pressure treatment.  She is advised to follow-up in clinic to see one of our nurse practitioners in 6 months, sooner if needed. I answered all her questions today and she was in agreement.   I spent 30 minutes in total face-to-face time and in reviewing records during pre-charting, more than 50% of which was spent in counseling and coordination of care, reviewing test results, reviewing medications and treatment regimen and/or in discussing or reviewing the diagnosis of OSA, the prognosis and treatment options. Pertinent laboratory and imaging test results that were available during this visit with the patient were reviewed by me and considered in my medical decision making (see chart for details).

## 2020-02-10 ENCOUNTER — Encounter: Payer: Self-pay | Admitting: Neurology

## 2020-02-10 DIAGNOSIS — G4730 Sleep apnea, unspecified: Secondary | ICD-10-CM | POA: Diagnosis not present

## 2020-02-10 DIAGNOSIS — R0683 Snoring: Secondary | ICD-10-CM | POA: Diagnosis not present

## 2020-02-12 ENCOUNTER — Telehealth: Payer: Self-pay | Admitting: Neurology

## 2020-02-12 NOTE — Telephone Encounter (Signed)
I reviewed the patient's overnight pulse oximetry test results from 02/10/2020.  Total duration of testing time was only 2 hours and 7 minutes.  She was on room air, on her AutoPap.  Average oxygen saturation 94%, nadir was 91%, computer-generated nadir of 86% appeared to be an artifact.   Please call patient, and advise her that based on her limited pulse oximetry data from 02/10/2020 of a little over 2 hours, it looks like her oxygen saturations are adequate while she is on AutoPap therapy.  Please advise her to maintain treatment and follow-up as scheduled.

## 2020-02-13 NOTE — Telephone Encounter (Signed)
I called the pt and advised of results via vm ( ok per dpr).  Pt was advised to call back if she had any questions/concerns.  Pt was advised to keep f/u has scheduled for March of 2022.

## 2020-02-24 DIAGNOSIS — G4733 Obstructive sleep apnea (adult) (pediatric): Secondary | ICD-10-CM | POA: Diagnosis not present

## 2020-02-26 DIAGNOSIS — G4733 Obstructive sleep apnea (adult) (pediatric): Secondary | ICD-10-CM | POA: Diagnosis not present

## 2020-03-28 DIAGNOSIS — G4733 Obstructive sleep apnea (adult) (pediatric): Secondary | ICD-10-CM | POA: Diagnosis not present

## 2020-06-02 DIAGNOSIS — E039 Hypothyroidism, unspecified: Secondary | ICD-10-CM | POA: Diagnosis not present

## 2020-06-02 DIAGNOSIS — E785 Hyperlipidemia, unspecified: Secondary | ICD-10-CM | POA: Diagnosis not present

## 2020-06-02 DIAGNOSIS — Z0000 Encounter for general adult medical examination without abnormal findings: Secondary | ICD-10-CM | POA: Diagnosis not present

## 2020-06-04 ENCOUNTER — Ambulatory Visit: Payer: BLUE CROSS/BLUE SHIELD | Admitting: Family Medicine

## 2020-06-04 ENCOUNTER — Telehealth: Payer: Self-pay | Admitting: *Deleted

## 2020-06-04 DIAGNOSIS — Z9989 Dependence on other enabling machines and devices: Secondary | ICD-10-CM

## 2020-06-04 DIAGNOSIS — G4733 Obstructive sleep apnea (adult) (pediatric): Secondary | ICD-10-CM

## 2020-06-04 NOTE — Telephone Encounter (Signed)
I called pt.  She was told to make appt with [place] by her pcp, Dr. Jarold Motto. She is wanting a new mask (has full face mask now and would like to try nasal pillow).  She was grateful that did not need to come in for appt if not needed.  I told her would touch base again with AL/NP and let her know.  But for now appt cancelled.

## 2020-06-04 NOTE — Progress Notes (Deleted)
PATIENT: Brittany Riley DOB: [birth year]  REASON FOR VISIT: follow up HISTORY FROM: patient  No chief complaint on file.    HISTORY OF PRESENT ILLNESS: Today 06/04/20 Brittany Riley is a 65 y.o. female here today for follow up for OSA on CPAP.    Compliance report dated    HISTORY: (copied from Dr Teofilo Pod previous note)  Brittany Riley is a 65 year old right-handed woman with an underlying medical history of hypertension, hypothyroidism, hyperlipidemia, history of Bell's palsy in 2016, for which I saw her some 4 years ago, and morbid obesity with a BMI of over 45, who Presents for follow-up consultation of her obstructive sleep apnea after sleep testing and starting AutoPap therapy.  The patient is unaccompanied today.  I saw her for sleep evaluation on Sep 26, 2019 at the request of her primary care physician, at which time she reported significant nocturia and morning headaches as well as nonrestorative sleep and snoring.  She was advised to proceed with sleep testing.  She had a home sleep test on November 13, 2019 which indicated severe obstructive sleep apnea with an AHI of 84.1/h, O2 nadir of 73%.  She was advised to start home AutoPap therapy.  Her set up date was December 27, 2019.  Today, January 28, 2020: I reviewed her AutoPap compliance data from 12/28/2019 through 01/26/2020, which is a total of 30 days, during which time she used her machine every night with percent use days greater than 4 h at 100%, indicating superb compliance, with an average usage of 8 h and 0 min, residual AHI at goal at 1.2/h, 95th percentile of pressure at 12.5 cm with a range of 7 to 14 cm with EPR, leak in the acceptable range with the 95th percentile at 12.7 L/min.  She reports that she is struggling with tolerance. The mask has bothered her from the get go but her husband got her some foam for padding. She had pressure points in the back of her head and also on the left side of her forehead. She  has not really felt any better as far as her sleep quality. She sleeps still an average of 5 to 6 hours. She is motivated to continue with treatment. She has been using a full facemask. She has gained a little bit of weight. She is typically a left side sleeper. She does report that her nocturia is better. She has to get up at the most once per night when it used to be about 3 times per night on average. She reports that her mouth dryness is actually better.  The patient's allergies, current medications, family history, past medical history, past social history, past surgical history and problem list were reviewed and updated as appropriate.    REVIEW OF SYSTEMS: Out of a complete 14 system review of symptoms, the patient complains only of the following symptoms, and all other reviewed systems are negative.  ALLERGIES: Allergies  Allergen Reactions  . Ampicillin   . Penicillins Rash    HOME MEDICATIONS: Outpatient Medications Prior to Visit  Medication Sig Dispense Refill  . amLODipine (NORVASC) 5 MG tablet Take 5 mg by mouth daily.    . carvedilol (COREG) 3.125 MG tablet Take 3.125 mg by mouth 2 (two) times daily.    . hydrochlorothiazide (MICROZIDE) 12.5 MG capsule Take 12.5 mg by mouth daily.    Marland Kitchen levothyroxine (SYNTHROID) 100 MCG tablet Take 100 mcg by mouth daily.    Marland Kitchen losartan (COZAAR) 100 MG  tablet Take 100 mg by mouth daily.     No facility-administered medications prior to visit.    PAST MEDICAL HISTORY: Past Medical History:  Diagnosis Date  . Chronic insomnia   . Dyslipidemia   . Facial droop   . Hemorrhoids   . Hypertension   . Hypothyroidism   . Microscopic hematuria     PAST SURGICAL HISTORY: Past Surgical History:  Procedure Laterality Date  . CESAREAN SECTION    . CHOLECYSTECTOMY    . CYSTOSCOPY    . LAPAROSCOPIC CHOLECYSTECTOMY WITH COMMON DUCT EXPLORATION AND INTRAOP CHOLANGIOGRAM      FAMILY HISTORY: Family History  Problem Relation Age of Onset   . Hypertension Mother   . COPD Mother   . Diabetes Father   . Coronary artery disease Father   . Hypertension Sister   . Hodgkin's lymphoma Brother   . Breast cancer Neg Hx     SOCIAL HISTORY: Social History   Socioeconomic History  . Marital status: Married    Spouse name: Not on file  . Number of children: 2  . Years of education: College  . Highest education level: Not on file  Occupational History  . Occupation: None   Tobacco Use  . Smoking status: Never Smoker  . Smokeless tobacco: Never Used  Substance and Sexual Activity  . Alcohol use: Yes    Alcohol/week: 0.0 standard drinks    Comment: Rare  . Drug use: No  . Sexual activity: Not on file  Other Topics Concern  . Not on file  Social History Narrative   Drinks 2 diet sodas a day    Social Determinants of Health   Financial Resource Strain: Not on file  Food Insecurity: Not on file  Transportation Needs: Not on file  Physical Activity: Not on file  Stress: Not on file  Social Connections: Not on file  Intimate Partner Violence: Not on file     PHYSICAL EXAM  There were no vitals filed for this visit. There is no height or weight on file to calculate BMI.  Generalized: Well developed, in no acute distress  Cardiology: normal rate and rhythm, no murmur noted Respiratory: clear to auscultation bilaterally  Neurological examination  Mentation: Alert oriented to time, place, history taking. Follows all commands speech and language fluent Cranial nerve II-XII: Pupils were equal round reactive to light. Extraocular movements were full, visual field were full  Motor: The motor testing reveals 5 over 5 strength of all 4 extremities. Good symmetric motor tone is noted throughout.  Gait and station: Gait is normal.    DIAGNOSTIC DATA (LABS, IMAGING, TESTING) - I reviewed patient records, labs, notes, testing and imaging myself where available.  No flowsheet data found.   No results found for: WBC, HGB,  HCT, MCV, PLT No results found for: NA, K, CL, CO2, GLUCOSE, BUN, CREATININE, CALCIUM, PROT, ALBUMIN, AST, ALT, ALKPHOS, BILITOT, GFRNONAA, GFRAA No results found for: CHOL, HDL, LDLCALC, LDLDIRECT, TRIG, CHOLHDL No results found for: FIEP3I No results found for: VITAMINB12 No results found for: TSH   ASSESSMENT AND PLAN 65 y.o. year old female  has a past medical history of Chronic insomnia, Dyslipidemia, Facial droop, Hemorrhoids, Hypertension, Hypothyroidism, and Microscopic hematuria. here with ***  No diagnosis found.   Brittany Riley is doing well on CPAP therapy. Compliance report reveals ***. *** was encouraged to continue using CPAP nightly and for greater than 4 hours each night. We will update supply orders as indicated. Risks of  untreated sleep apnea review and education materials provided. Healthy lifestyle habits encouraged. *** will follow up in ***, sooner if needed. *** verbalizes understanding and agreement with this plan.   No orders of the defined types were placed in this encounter.    No orders of the defined types were placed in this encounter.     I spent 15 minutes with the patient. 50% of this time was spent counseling and educating patient on plan of care and medications.    Shawnie Dapper, FNP-C 06/04/2020, 12:44 PM [facility] [address], [address] [place], [place] [address] [phone]

## 2020-06-04 NOTE — Patient Instructions (Incomplete)
Please continue using your CPAP regularly. While your insurance requires that you use CPAP at least 4 hours each night on 70% of the nights, I recommend, that you not skip any nights and use it throughout the night if you can. Getting used to CPAP and staying with the treatment long term does take time and patience and discipline. Untreated obstructive sleep apnea when it is moderate to severe can have an adverse impact on cardiovascular health and raise her risk for heart disease, arrhythmias, hypertension, congestive heart failure, stroke and diabetes. Untreated obstructive sleep apnea causes sleep disruption, nonrestorative sleep, and sleep deprivation. This can have an impact on your day to day functioning and cause daytime sleepiness and impairment of cognitive function, memory loss, mood disturbance, and problems focussing. Using CPAP regularly can improve these symptoms.   Follow up in 1 year   Sleep Apnea Sleep apnea affects breathing during sleep. It causes breathing to stop for a short time or to become shallow. It can also increase the risk of:  Heart attack.  Stroke.  Being very overweight (obese).  Diabetes.  Heart failure.  Irregular heartbeat. The goal of treatment is to help you breathe normally again. What are the causes? There are three kinds of sleep apnea:  Obstructive sleep apnea. This is caused by a blocked or collapsed airway.  Central sleep apnea. This happens when the brain does not send the right signals to the muscles that control breathing.  Mixed sleep apnea. This is a combination of obstructive and central sleep apnea. The most common cause of this condition is a collapsed or blocked airway. This can happen if:  Your throat muscles are too relaxed.  Your tongue and tonsils are too large.  You are overweight.  Your airway is too small.   What increases the risk?  Being overweight.  Smoking.  Having a small airway.  Being older.  Being  female.  Drinking alcohol.  Taking medicines to calm yourself (sedatives or tranquilizers).  Having family members with the condition. What are the signs or symptoms?  Trouble staying asleep.  Being sleepy or tired during the day.  Getting angry a lot.  Loud snoring.  Headaches in the morning.  Not being able to focus your mind (concentrate).  Forgetting things.  Less interest in sex.  Mood swings.  Personality changes.  Feelings of sadness (depression).  Waking up a lot during the night to pee (urinate).  Dry mouth.  Sore throat. How is this diagnosed?  Your medical history.  A physical exam.  A test that is done when you are sleeping (sleep study). The test is most often done in a sleep lab but may also be done at home. How is this treated?  Sleeping on your side.  Using a medicine to get rid of mucus in your nose (decongestant).  Avoiding the use of alcohol, medicines to help you relax, or certain pain medicines (narcotics).  Losing weight, if needed.  Changing your diet.  Not smoking.  Using a machine to open your airway while you sleep, such as: ? An oral appliance. This is a mouthpiece that shifts your lower jaw forward. ? A CPAP device. This device blows air through a mask when you breathe out (exhale). ? An EPAP device. This has valves that you put in each nostril. ? A BPAP device. This device blows air through a mask when you breathe in (inhale) and breathe out.  Having surgery if other treatments do not work. It   is important to get treatment for sleep apnea. Without treatment, it can lead to:  High blood pressure.  Coronary artery disease.  In men, not being able to have an erection (impotence).  Reduced thinking ability.   Follow these instructions at home: Lifestyle  Make changes that your doctor recommends.  Eat a healthy diet.  Lose weight if needed.  Avoid alcohol, medicines to help you relax, and some pain  medicines.  Do not use any products that contain nicotine or tobacco, such as cigarettes, e-cigarettes, and chewing tobacco. If you need help quitting, ask your doctor. General instructions  Take over-the-counter and prescription medicines only as told by your doctor.  If you were given a machine to use while you sleep, use it only as told by your doctor.  If you are having surgery, make sure to tell your doctor you have sleep apnea. You may need to bring your device with you.  Keep all follow-up visits as told by your doctor. This is important. Contact a doctor if:  The machine that you were given to use during sleep bothers you or does not seem to be working.  You do not get better.  You get worse. Get help right away if:  Your chest hurts.  You have trouble breathing in enough air.  You have an uncomfortable feeling in your back, arms, or stomach.  You have trouble talking.  One side of your body feels weak.  A part of your face is hanging down. These symptoms may be an emergency. Do not wait to see if the symptoms will go away. Get medical help right away. Call your local emergency services (911 in the [place] Do not drive yourself to the hospital. Summary  This condition affects breathing during sleep.  The most common cause is a collapsed or blocked airway.  The goal of treatment is to help you breathe normally while you sleep. This information is not intended to replace advice given to you by your health care provider. Make sure you discuss any questions you have with your health care provider. Document Revised: 02/02/2018 Document Reviewed: 12/12/2017 Elsevier Patient Education  2021 Elsevier Inc.  

## 2020-06-09 DIAGNOSIS — E785 Hyperlipidemia, unspecified: Secondary | ICD-10-CM | POA: Diagnosis not present

## 2020-06-09 DIAGNOSIS — I10 Essential (primary) hypertension: Secondary | ICD-10-CM | POA: Diagnosis not present

## 2020-06-09 DIAGNOSIS — R82998 Other abnormal findings in urine: Secondary | ICD-10-CM | POA: Diagnosis not present

## 2020-06-09 DIAGNOSIS — Z0000 Encounter for general adult medical examination without abnormal findings: Secondary | ICD-10-CM | POA: Diagnosis not present

## 2020-06-09 DIAGNOSIS — M25561 Pain in right knee: Secondary | ICD-10-CM | POA: Diagnosis not present

## 2020-06-09 DIAGNOSIS — Z1212 Encounter for screening for malignant neoplasm of rectum: Secondary | ICD-10-CM | POA: Diagnosis not present

## 2020-07-08 ENCOUNTER — Telehealth: Payer: Self-pay | Admitting: Neurology

## 2020-07-08 NOTE — Telephone Encounter (Signed)
Pt. states she has not been called about face mask & is wanting to follow up with this. Please advise.

## 2020-07-08 NOTE — Telephone Encounter (Signed)
  I have reached out to [employer] on this to get more info.

## 2020-07-13 NOTE — Telephone Encounter (Signed)
Received feed back from [employer], Shanda Bumps sts order was never received from AL, NP on 06/04/20 and they would process now.  I have advised the pt of this information and asked her to call back if she has not heard from the equipment company by tomorrow or wed.

## 2020-07-29 ENCOUNTER — Ambulatory Visit (INDEPENDENT_AMBULATORY_CARE_PROVIDER_SITE_OTHER): Payer: BC Managed Care – PPO | Admitting: Adult Health

## 2020-07-29 ENCOUNTER — Encounter: Payer: Self-pay | Admitting: Adult Health

## 2020-07-29 VITALS — BP 142/81 | HR 89 | Ht 67.0 in | Wt 318.0 lb

## 2020-07-29 DIAGNOSIS — Z6841 Body Mass Index (BMI) 40.0 and over, adult: Secondary | ICD-10-CM | POA: Diagnosis not present

## 2020-07-29 DIAGNOSIS — G4733 Obstructive sleep apnea (adult) (pediatric): Secondary | ICD-10-CM | POA: Diagnosis not present

## 2020-07-29 DIAGNOSIS — Z9989 Dependence on other enabling machines and devices: Secondary | ICD-10-CM

## 2020-07-29 NOTE — Patient Instructions (Signed)
Continue using CPAP nightly and greater than 4 hours each night Pressure increased to 12  If your symptoms worsen or you develop new symptoms please let us know.

## 2020-07-29 NOTE — Progress Notes (Addendum)
PATIENT: Brittany Riley DOB: 03/18/1956  REASON FOR VISIT: follow up HISTORY FROM: patient  HISTORY OF PRESENT ILLNESS: Today 07/29/20:  Ms. Beitz is a 65 year old female with a history of obstructive sleep apnea on CPAP.  She returns today for follow-up.  The patient reports that she has really not noticed any difference since she started the CPAP.  She states that she is still fatigued throughout the day.  On her fatigue score she scored a 23 which surprisingly is a good score.  She states that when she wakes up in the morning she does not eat her breakfast which usually consists of toast and goes and gets back in bed and reads and typically will fall back asleep.  She states that this is the "fatigue" that she is experiencing.  Reports that when she gets up and starts moving she does not have significant fatigue.  Occasionally she will yawn throughout the day.  The patient has also had a 50 pound weight gain since her visit with [place] in 2017.  Reports that she is not exercising much or watching her diet.    HISTORY January 28, 2020: I reviewed her AutoPap compliance data from 12/28/2019 through 01/26/2020, which is a total of 30 days, during which time she used her machine every night with percent use days greater than 4 h at 100%, indicating superb compliance, with an average usage of 8 h and 0 min, residual AHI at goal at 1.2/h, 95th percentile of pressure at 12.5 cm with a range of 7 to 14 cm with EPR, leak in the acceptable range with the 95th percentile at 12.7 L/min.  She reports that she is struggling with tolerance. The mask has bothered her from the get go but her husband got her some foam for padding. She had pressure points in the back of her head and also on the left side of her forehead. She has not really felt any better as far as her sleep quality. She sleeps still an average of 5 to 6 hours. She is motivated to continue with treatment. She has been using a full facemask. She  has gained a little bit of weight. She is typically a left side sleeper. She does report that her nocturia is better. She has to get up at the most once per night when it used to be about 3 times per night on average. She reports that her mouth dryness is actually better.  REVIEW OF SYSTEMS: Out of a complete 14 system review of symptoms, the patient complains only of the following symptoms, and all other reviewed systems are negative.  FSS 23 ESS 1  ALLERGIES: Allergies  Allergen Reactions  . Ampicillin   . Penicillins Rash    HOME MEDICATIONS: Outpatient Medications Prior to Visit  Medication Sig Dispense Refill  . amLODipine (NORVASC) 5 MG tablet Take 5 mg by mouth in the morning and at bedtime.    . hydrochlorothiazide (MICROZIDE) 12.5 MG capsule Take 12.5 mg by mouth daily.    Marland Kitchen levothyroxine (SYNTHROID) 100 MCG tablet Take 100 mcg by mouth daily.    Marland Kitchen losartan (COZAAR) 100 MG tablet Take 100 mg by mouth daily.    . carvedilol (COREG) 3.125 MG tablet Take 3.125 mg by mouth 2 (two) times daily.     No facility-administered medications prior to visit.    PAST MEDICAL HISTORY: Past Medical History:  Diagnosis Date  . Chronic insomnia   . Dyslipidemia   . Facial droop   .  Hemorrhoids   . Hypertension   . Hypothyroidism   . Microscopic hematuria     PAST SURGICAL HISTORY: Past Surgical History:  Procedure Laterality Date  . CESAREAN SECTION    . CHOLECYSTECTOMY    . CYSTOSCOPY    . LAPAROSCOPIC CHOLECYSTECTOMY WITH COMMON DUCT EXPLORATION AND INTRAOP CHOLANGIOGRAM      FAMILY HISTORY: Family History  Problem Relation Age of Onset  . Hypertension Mother   . COPD Mother   . Diabetes Father   . Coronary artery disease Father   . Hypertension Sister   . Hodgkin's lymphoma Brother   . Breast cancer Neg Hx     SOCIAL HISTORY: Social History   Socioeconomic History  . Marital status: Married    Spouse name: Not on file  . Number of children: 2  . Years of  education: College  . Highest education level: Not on file  Occupational History  . Occupation: None   Tobacco Use  . Smoking status: Never Smoker  . Smokeless tobacco: Never Used  Substance and Sexual Activity  . Alcohol use: Yes    Alcohol/week: 0.0 standard drinks    Comment: Rare  . Drug use: No  . Sexual activity: Not on file  Other Topics Concern  . Not on file  Social History Narrative   Drinks 2 diet sodas a day    Social Determinants of Health   Financial Resource Strain: Not on file  Food Insecurity: Not on file  Transportation Needs: Not on file  Physical Activity: Not on file  Stress: Not on file  Social Connections: Not on file  Intimate Partner Violence: Not on file      PHYSICAL EXAM  Vitals:   07/29/20 1018  BP: (!) 142/81  Pulse: 89  Weight: (!) 318 lb (144.2 kg)  Height: 5\' 7"  (1.702 m)   Body mass index is 49.81 kg/m.  Generalized: Well developed, in no acute distress  Chest: Lungs clear to auscultation bilaterally  Neurological examination  Mentation: Alert oriented to time, place, history taking. Follows all commands speech and language fluent Cranial nerve II-XII: Extraocular movements were full, visual field were full on confrontational test Head turning and shoulder shrug  were normal and symmetric. Motor: The motor testing reveals 5 over 5 strength of all 4 extremities. Good symmetric motor tone is noted throughout.  Sensory: Sensory testing is intact to soft touch on all 4 extremities. No evidence of extinction is noted.  Gait and station: Gait is normal.    DIAGNOSTIC DATA (LABS, IMAGING, TESTING) - I reviewed patient records, labs, notes, testing and imaging myself where available.     ASSESSMENT AND PLAN 64 y.o. year old female  has a past medical history of Chronic insomnia, Dyslipidemia, Facial droop, Hemorrhoids, Hypertension, Hypothyroidism, and Microscopic hematuria. here with:  1. OSA on CPAP 2.   Obesity  - CPAP  compliance excellent - Good treatment of AHI  - Encourage patient to use CPAP nightly and > 4 hours each night -Discussed weight management and exercise - F/U in 6 months or sooner if needed  I spent 30 minutes of face-to-face and non-face-to-face time with patient.  This included previsit chart review, lab review, study review, order entry, electronic health record documentation, patient education.  77, MSN, NP-C 07/29/2020, 10:37 AM [facility] [address], [address] [place], [place] [phone]  I reviewed the above note and documentation by the Nurse Practitioner and agree with the history, exam, assessment  and plan as outlined above. I was available for consultation. [name], MD, PhD [facility])

## 2020-12-15 DIAGNOSIS — I10 Essential (primary) hypertension: Secondary | ICD-10-CM | POA: Diagnosis not present

## 2021-02-01 ENCOUNTER — Ambulatory Visit (INDEPENDENT_AMBULATORY_CARE_PROVIDER_SITE_OTHER): Payer: BC Managed Care – PPO | Admitting: Neurology

## 2021-02-01 ENCOUNTER — Encounter: Payer: Self-pay | Admitting: Neurology

## 2021-02-01 VITALS — BP 135/75 | HR 73 | Ht 66.0 in | Wt 307.4 lb

## 2021-02-01 DIAGNOSIS — G4733 Obstructive sleep apnea (adult) (pediatric): Secondary | ICD-10-CM

## 2021-02-01 DIAGNOSIS — Z9989 Dependence on other enabling machines and devices: Secondary | ICD-10-CM | POA: Diagnosis not present

## 2021-02-01 NOTE — Progress Notes (Signed)
Subjective:    Patient ID: Brittany Riley is a 65 y.o. female.  HPI    Interim history:     Brittany Riley is a 65 year old right-handed woman with an underlying medical history of hypertension, hypothyroidism, hyperlipidemia, history of Bell's palsy in 2016, for which I saw her some 4 years ago, and morbid obesity with a BMI of over 45, who presents for follow-up consultation of her obstructive sleep apnea, on CPAP therapy.  The patient is unaccompanied today. I last saw her on 01/28/2020, at which time she was struggling with tolerance.  She was compliant with treatment.  She was agreeable to continuing with therapy.  We proceeded with an overnight pulse oximetry test to make sure her oxygen saturations were adequate.  She had a pulse oximetry test on 02/10/2020 which showed adequate oxygen saturations while asleep and while on treatment.  She had a subsequent appointment with Brittany Penny, NP, on 07/29/2020, at which time she was compliant with her CPAP of 11 but had instances of not getting enough air.  Her pressure was therefore increased to 12 cm at the time.  Today, 02/01/2021: I reviewed her CPAP compliance data from 12/29/2020 through 01/25/2021, which is a total of 30 days, during which time she used her machine every night with percent use days greater than 4 hours at 100%, indicating superb compliance with an average usage of 7 hours and 8 minutes, residual AHI at goal at 0.9/h, leak acceptable with a 95th percentile at 12 L/min on a pressure of 12 cm with EPR of 3.  She reported still not being perfectly happy with her CPAP but it is easier to tolerate, she had a better fitting mask and the pressure seems okay.  She is motivated to continue with treatment but does not necessarily feel any improvement in her energy level.  She is agreeable to maintaining her treatment at the current settings with the current mask.  She has had some changes on her medications, she is no longer on Norvasc due  to swelling and has been on carvedilol twice daily.  The patient's allergies, current medications, family history, past medical history, past social history, past surgical history and problem list were reviewed and updated as appropriate.    Previously:  I saw her for sleep evaluation on Sep 26, 2019 at the request of her primary care physician, at which time she reported significant nocturia and morning headaches as well as nonrestorative sleep and snoring.  She was advised to proceed with sleep testing.  She had a home sleep test on November 13, 2019 which indicated severe obstructive sleep apnea with an AHI of 84.1/h, O2 nadir of 73%.  She was advised to start home AutoPap therapy.  Her set up date was December 27, 2019. She reports that she is struggling with tolerance. The mask has bothered her from the get go but her husband got her some foam for padding. She had pressure points in the back of her head and also on the left side of her forehead. She has not really felt any better as far as her sleep quality. She sleeps still an average of 5 to 6 hours. She is motivated to continue with treatment. She has been using a full facemask. She has gained a little bit of weight. She is typically a left side sleeper. She does report that her nocturia is better. She has to get up at the most once per night when it used to be about 3  times per night on average. She reports that her mouth dryness is actually better.    I reviewed her AutoPap compliance data from 12/28/2019 through 01/26/2020, which is a total of 30 days, during which time she used her machine every night with percent use days greater than 4 h at 100%, indicating superb compliance, with an average usage of 8 h and 0 min, residual AHI at goal at 1.2/h, 95th percentile of pressure at 12.5 cm with a range of 7 to 14 cm with EPR, leak in the acceptable range with the 95th percentile at 12.7 L/min.     09/26/19: (She) reports snoring and nonrestorative sleep,  difficulty falling asleep.  She has significant nocturia about 3 times per average night and has had rare morning headaches, has also experienced on a rare occasion nocturnal enuresis.  She goes to bed around 930 but likes to read in bed, she may fall asleep around 11 PM.  She reports sinus congestion at night and some postnasal drip but no history of allergies per se.  She wakes up with a dry mouth.  She has no family history of sleep apnea.  She lives with her husband, no pets in the house.  She has had witnessed apneas per husband's report.  Her Epworth sleepiness score is 2 out of 24, fatigue severity score is 18 out of 63.  She does not currently work. I reviewed your office note from 09/03/2019.  She has had weight gain in the recent past.  She was recently started on carvedilol.     05/14/2015: 65 year old right-handed woman with an underlying medical history of hypertension, hyperlipidemia, hypothyroidism, and morbid obesity, who reports new onset left facial weakness and left facial twitching. Her symptoms started in September 2016 when she first noticed intermittent twitching of her left face. Around Thanksgiving 2016 she noticed facial weakness. Since then, she has improved some with regards to her facial weakness. Her facial twitching is not always the same. It comes and goes. It is somewhat bothersome to her but not all the time. She has no pain. She had no prodromal illness as far she recalls. She had no other weakness or numbness or tingling elsewhere in her body. She had no headache and no ear pain, no hearing loss or exaggerated sound sensitivity. Overall, she feels quite well. She also recalls no additional stressors or problems at the time. She feels that she sleeps fairly well but she does snore some. She is motivated to lose weight. She denies any gasping sensations, morning headaches but does have occasional nocturia.   You ordered a brain MRI. She had a brain MRI without contrast on  05/12/2015: IMPRESSION: Normal for age noncontrast MRI appearance of the brain.   In addition, I personally reviewed the images through the PACS system.    I reviewed your office note from 04/22/2015, which you kindly included.  Her Past Medical History Is Significant For: Past Medical History:  Diagnosis Date   Chronic insomnia    Dyslipidemia    Facial droop    Hemorrhoids    Hypertension    Hypothyroidism    Microscopic hematuria     Her Past Surgical History Is Significant For: Past Surgical History:  Procedure Laterality Date   CESAREAN SECTION     CHOLECYSTECTOMY     CYSTOSCOPY     LAPAROSCOPIC CHOLECYSTECTOMY WITH COMMON DUCT EXPLORATION AND INTRAOP CHOLANGIOGRAM      Her Family History Is Significant For: Family History  Problem Relation  Age of Onset   Hypertension Mother    COPD Mother    Diabetes Father    Coronary artery disease Father    Hypertension Sister    Hodgkin's lymphoma Brother    Breast cancer Neg Hx    Sleep apnea Neg Hx     Her Social History Is Significant For: Social History   Socioeconomic History   Marital status: Married    Spouse name: Not on file   Number of children: 2   Years of education: College   Highest education level: Not on file  Occupational History   Occupation: None   Tobacco Use   Smoking status: Never   Smokeless tobacco: Never  Substance and Sexual Activity   Alcohol use: Yes    Alcohol/week: 0.0 standard drinks    Comment: Rare   Drug use: No   Sexual activity: Not on file  Other Topics Concern   Not on file  Social History Narrative   Drinks 2 diet sodas a day    Social Determinants of Health   Financial Resource Strain: Not on file  Food Insecurity: Not on file  Transportation Needs: Not on file  Physical Activity: Not on file  Stress: Not on file  Social Connections: Not on file    Her Allergies Are:  Allergies  Allergen Reactions   Ampicillin    Penicillins Rash  :   Her Current  Medications Are:  Outpatient Encounter Medications as of 02/01/2021  Medication Sig   carvedilol (COREG) 12.5 MG tablet Take 12.5 mg by mouth 2 (two) times daily.   hydrochlorothiazide (MICROZIDE) 12.5 MG capsule Take 12.5 mg by mouth daily.   levothyroxine (SYNTHROID) 100 MCG tablet Take 100 mcg by mouth daily.   losartan (COZAAR) 100 MG tablet Take 100 mg by mouth daily.   amLODipine (NORVASC) 5 MG tablet Take 5 mg by mouth in the morning and at bedtime.   No facility-administered encounter medications on file as of 02/01/2021.  :  Review of Systems:  Out of a complete 14 point review of systems, all are reviewed and negative with the exception of these symptoms as listed below:   Review of Systems  Neurological:        Pt is here for follow up for CPAP . Pt states she does use CPAP but it makes her feel worse . Pt states she does sleep at night. Pt states she feels like that the CPAP has not helped her . Pt states she still has fatigue some days. Pt states she needs a new mask .    Objective:  Neurological Exam  Physical Exam Physical Examination:   Vitals:   02/01/21 1039  BP: 135/75  Pulse: 73    General Examination: The patient is a very pleasant 66 y.o. female in no acute distress. She appears well-developed and well-nourished and well groomed.   HEENT: Normocephalic, atraumatic, pupils are equal, round and reactive to light, extraocular tracking is good without limitation to gaze excursion or nystagmus noted. Hearing is grossly intact. Face is mildly asymmetric with mild left lower facial weakness noted and intermittent mild twitching around the left eye and midface area, all stable. Speech is clear, airway examination reveals a small airway entry and small mouth opening. Tongue protrudes centrally and palate elevates symmetrically.    Chest: Clear to auscultation without wheezing, rhonchi or crackles noted.   Heart: S1+S2+0, regular and normal without murmurs, rubs or  gallops noted.    Abdomen: Soft, non-tender  and non-distended.   Extremities: There is no pitting edema in the distal lower extremities bilaterally.    Skin: Warm and dry without trophic changes noted.    Musculoskeletal: exam reveals no obvious joint deformities.    Neurologically:  Mental status: The patient is awake, alert and oriented in all 4 spheres. Her immediate and remote memory, attention, language skills and fund of knowledge are appropriate. There is no evidence of aphasia, agnosia, apraxia or anomia. Speech is clear with normal prosody and enunciation. Thought process is linear. Mood is normal and affect is normal.  Cranial nerves II - XII are as described above under HEENT exam.  Motor exam: Normal bulk, strength and tone is noted. There is no tremor, fine motor skills and coordination: grossly intact.  Cerebellar testing: No dysmetria or intention tremor. There is no truncal or gait ataxia.  Sensory exam: intact to light touch in the upper and lower extremities.  Gait, station and balance: She stands easily. No veering to one side is noted. No leaning to one side is noted. Posture is age-appropriate and stance is narrow based. Gait shows normal stride length and normal pace. No problems turning are noted.    Assessment and plan:    In summary, Brittany Riley is a very pleasant 65 year old female  with an underlying medical history of hypertension, hypothyroidism, hyperlipidemia, history of Bell's palsy in 2016, and morbid obesity with a BMI of over 45, who presents for follow-up consultation of her obstructive sleep apnea, which was determined to be in the severe range by home sleep testing on 11/13/2019 (AHI was 84.1/h, O2 nadir 73%). She has been on autoPap therapy since 12/27/2019 and is fully compliant with treatment but has not noticed any significant improvement in her sleep quality and daytime symptoms but did endorse improvement in her nocturia and mouth dryness since  starting positive airway pressure treatment.  She is currently on CPAP of 12 cm with good tolerance and superb compliance.  She is commended for her treatment adherence. She is advised at this juncture agreeable to a 1 year checkup with the nurse practitioner.  She is encouraged to call us with any interim questions or concerns.  I answered all her questions today and she was in agreement with the plan.  I spent 20 minutes in total face-to-face time and in reviewing records during pre-charting, more than 50% of which was spent in counseling and coordination of care, reviewing test results, reviewing medications and treatment regimen and/or in discussing or reviewing the diagnosis of OSA, the prognosis and treatment options. Pertinent laboratory and imaging test results that were available during this visit with the patient were reviewed by me and considered in my medical decision making (see chart for details).

## 2021-04-20 DIAGNOSIS — H524 Presbyopia: Secondary | ICD-10-CM | POA: Diagnosis not present

## 2021-06-22 DIAGNOSIS — E039 Hypothyroidism, unspecified: Secondary | ICD-10-CM | POA: Diagnosis not present

## 2021-06-22 DIAGNOSIS — I10 Essential (primary) hypertension: Secondary | ICD-10-CM | POA: Diagnosis not present

## 2021-06-22 DIAGNOSIS — E785 Hyperlipidemia, unspecified: Secondary | ICD-10-CM | POA: Diagnosis not present

## 2021-06-29 DIAGNOSIS — Z1339 Encounter for screening examination for other mental health and behavioral disorders: Secondary | ICD-10-CM | POA: Diagnosis not present

## 2021-06-29 DIAGNOSIS — Z0000 Encounter for general adult medical examination without abnormal findings: Secondary | ICD-10-CM | POA: Diagnosis not present

## 2021-06-29 DIAGNOSIS — Z1331 Encounter for screening for depression: Secondary | ICD-10-CM | POA: Diagnosis not present

## 2021-06-29 DIAGNOSIS — G4733 Obstructive sleep apnea (adult) (pediatric): Secondary | ICD-10-CM | POA: Diagnosis not present

## 2021-07-21 DIAGNOSIS — M25561 Pain in right knee: Secondary | ICD-10-CM | POA: Diagnosis not present

## 2021-12-13 ENCOUNTER — Other Ambulatory Visit: Payer: Self-pay | Admitting: Internal Medicine

## 2021-12-13 DIAGNOSIS — Z1231 Encounter for screening mammogram for malignant neoplasm of breast: Secondary | ICD-10-CM

## 2021-12-15 ENCOUNTER — Ambulatory Visit
Admission: RE | Admit: 2021-12-15 | Discharge: 2021-12-15 | Disposition: A | Discharge status: Home / Self Care | Payer: Medicare Other | Source: Ambulatory Visit | Attending: Internal Medicine | Admitting: Internal Medicine

## 2021-12-15 DIAGNOSIS — Z1231 Encounter for screening mammogram for malignant neoplasm of breast: Secondary | ICD-10-CM

## 2022-02-02 ENCOUNTER — Ambulatory Visit: Payer: BC Managed Care – PPO | Admitting: Adult Health

## 2022-03-01 NOTE — Progress Notes (Unsigned)
PATIENT: Brittany Riley DOB: 13-Feb-1956  REASON FOR VISIT: follow up HISTORY FROM: patient  HISTORY OF PRESENT ILLNESS: Today 03/01/22:    07/29/20: Brittany Riley is a 66 year old female with a history of obstructive sleep apnea on CPAP.  She returns today for follow-up.  The patient reports that she has really not noticed any difference since she started the CPAP.  She states that she is still fatigued throughout the day.  On her fatigue score she scored a 23 which surprisingly is a good score.  She states that when she wakes up in the morning she does not eat her breakfast which usually consists of toast and goes and gets back in bed and reads and typically will fall back asleep.  She states that this is the "fatigue" that she is experiencing.  Reports that when she gets up and starts moving she does not have significant fatigue.  Occasionally she will yawn throughout the day.  The patient has also had a 50 pound weight gain since her visit with [place] in 2017.  Reports that she is not exercising much or watching her diet.    HISTORY January 28, 2020: I reviewed her AutoPap compliance data from 12/28/2019 through 01/26/2020, which is a total of 30 days, during which time she used her machine every night with percent use days greater than 4 h at 100%, indicating superb compliance, with an average usage of 8 h and 0 min, residual AHI at goal at 1.2/h, 95th percentile of pressure at 12.5 cm with a range of 7 to 14 cm with EPR, leak in the acceptable range with the 95th percentile at 12.7 L/min.  She reports that she is struggling with tolerance. The mask has bothered her from the get go but her husband got her some foam for padding. She had pressure points in the back of her head and also on the left side of her forehead. She has not really felt any better as far as her sleep quality. She sleeps still an average of 5 to 6 hours. She is motivated to continue with treatment. She has been using a full  facemask. She has gained a little bit of weight. She is typically a left side sleeper. She does report that her nocturia is better. She has to get up at the most once per night when it used to be about 3 times per night on average. She reports that her mouth dryness is actually better.  REVIEW OF SYSTEMS: Out of a complete 14 system review of symptoms, the patient complains only of the following symptoms, and all other reviewed systems are negative.  FSS 23 ESS 1  ALLERGIES: Allergies  Allergen Reactions   Ampicillin    Penicillins Rash    HOME MEDICATIONS: Outpatient Medications Prior to Visit  Medication Sig Dispense Refill   amLODipine (NORVASC) 5 MG tablet Take 5 mg by mouth in the morning and at bedtime.     carvedilol (COREG) 12.5 MG tablet Take 12.5 mg by mouth 2 (two) times daily.     hydrochlorothiazide (MICROZIDE) 12.5 MG capsule Take 12.5 mg by mouth daily.     levothyroxine (SYNTHROID) 100 MCG tablet Take 100 mcg by mouth daily.     losartan (COZAAR) 100 MG tablet Take 100 mg by mouth daily.     No facility-administered medications prior to visit.    PAST MEDICAL HISTORY: Past Medical History:  Diagnosis Date   Chronic insomnia    Dyslipidemia  Facial droop    Hemorrhoids    Hypertension    Hypothyroidism    Microscopic hematuria     PAST SURGICAL HISTORY: Past Surgical History:  Procedure Laterality Date   CESAREAN SECTION     CHOLECYSTECTOMY     CYSTOSCOPY     LAPAROSCOPIC CHOLECYSTECTOMY WITH COMMON DUCT EXPLORATION AND INTRAOP CHOLANGIOGRAM      FAMILY HISTORY: Family History  Problem Relation Age of Onset   Hypertension Mother    COPD Mother    Diabetes Father    Coronary artery disease Father    Hypertension Sister    Hodgkin's lymphoma Brother    Breast cancer Neg Hx    Sleep apnea Neg Hx     SOCIAL HISTORY: Social History   Socioeconomic History   Marital status: Married    Spouse name: Not on file   Number of children: 2    Years of education: College   Highest education level: Not on file  Occupational History   Occupation: None   Tobacco Use   Smoking status: Never   Smokeless tobacco: Never  Substance and Sexual Activity   Alcohol use: Yes    Alcohol/week: 0.0 standard drinks of alcohol    Comment: Rare   Drug use: No   Sexual activity: Not on file  Other Topics Concern   Not on file  Social History Narrative   Drinks 2 diet sodas a day    Social Determinants of Health   Financial Resource Strain: Not on file  Food Insecurity: Not on file  Transportation Needs: Not on file  Physical Activity: Not on file  Stress: Not on file  Social Connections: Not on file  Intimate Partner Violence: Not on file      PHYSICAL EXAM  There were no vitals filed for this visit.  There is no height or weight on file to calculate BMI.  Generalized: Well developed, in no acute distress  Chest: Lungs clear to auscultation bilaterally  Neurological examination  Mentation: Alert oriented to time, place, history taking. Follows all commands speech and language fluent Cranial nerve II-XII: Extraocular movements were full, visual field were full on confrontational test Head turning and shoulder shrug  were normal and symmetric. Motor: The motor testing reveals 5 over 5 strength of all 4 extremities. Good symmetric motor tone is noted throughout.  Sensory: Sensory testing is intact to soft touch on all 4 extremities. No evidence of extinction is noted.  Gait and station: Gait is normal.    DIAGNOSTIC DATA (LABS, IMAGING, TESTING) - I reviewed patient records, labs, notes, testing and imaging myself where available.     ASSESSMENT AND PLAN 66 y.o. year old female  has a past medical history of Chronic insomnia, Dyslipidemia, Facial droop, Hemorrhoids, Hypertension, Hypothyroidism, and Microscopic hematuria. here with:  OSA on CPAP   Obesity  - CPAP compliance excellent - Good treatment of AHI  -  Encourage patient to use CPAP nightly and > 4 hours each night -Discussed weight management and exercise - F/U in 6 months or sooner if needed  I spent 30 minutes of face-to-face and non-face-to-face time with patient.  This included previsit chart review, lab review, study review, order entry, electronic health record documentation, patient education.  Butch Penny, MSN, NP-C 03/01/2022, 3:41 PM [facility] [address] [place], [place] [address] [phone]

## 2022-03-02 ENCOUNTER — Encounter: Payer: Self-pay | Admitting: Adult Health

## 2022-03-02 ENCOUNTER — Ambulatory Visit (INDEPENDENT_AMBULATORY_CARE_PROVIDER_SITE_OTHER): Payer: Medicare Other | Admitting: Adult Health

## 2022-03-02 VITALS — BP 146/85 | HR 72 | Ht 66.0 in | Wt 317.4 lb

## 2022-03-02 DIAGNOSIS — G4733 Obstructive sleep apnea (adult) (pediatric): Secondary | ICD-10-CM

## 2022-03-02 DIAGNOSIS — G479 Sleep disorder, unspecified: Secondary | ICD-10-CM | POA: Diagnosis not present

## 2022-03-02 NOTE — Patient Instructions (Signed)
Continue using CPAP nightly and greater than 4 hours each night °If your symptoms worsen or you develop new symptoms please let us know.  ° °

## 2022-03-17 ENCOUNTER — Telehealth: Payer: Self-pay | Admitting: Adult Health

## 2022-03-17 NOTE — Telephone Encounter (Signed)
I don't see where we have that. I reached out to [facility] w/ [employer] to see if that can be sent again.

## 2022-03-17 NOTE — Telephone Encounter (Signed)
Received call from [employer] regarding a fax they sent to [place] regarding patients CPAP supplies. She asked if we have received the pages asking for provider's signature I told her I was not sure as I don't see anything documented, she ask if I would put a message in to see if this has been received and completed. She did not leave a call back number or fax number, call was disconnected quickly.

## 2022-03-28 NOTE — Telephone Encounter (Signed)
Orders and last ofv note to be faxed to [phone] once signed.

## 2022-03-28 NOTE — Telephone Encounter (Signed)
Ander Slade is calling from [employer]. Stated they haven't received faxed for CPAP supplies. Stated she also needs chart notes.

## 2022-03-28 NOTE — Telephone Encounter (Signed)
I spoke with [facility] @ [employer]. He will fax the order to [place] again so this can be signed. I also printed last office note from 03/02/22. We will include this with the order once we receive it.

## 2022-03-29 NOTE — Telephone Encounter (Signed)
Faxed to [phone] [employer].  Fax confirmation received.

## 2022-06-20 IMAGING — MG DIGITAL SCREENING BILAT W/ TOMO W/ CAD
8 series · 8 of 24 positions shown · non-contrast
Comparison: Previous exam(s).

CLINICAL DATA: Screening.

EXAM:
DIGITAL SCREENING BILATERAL MAMMOGRAM WITH TOMO AND CAD

[R MLO synth-2D]
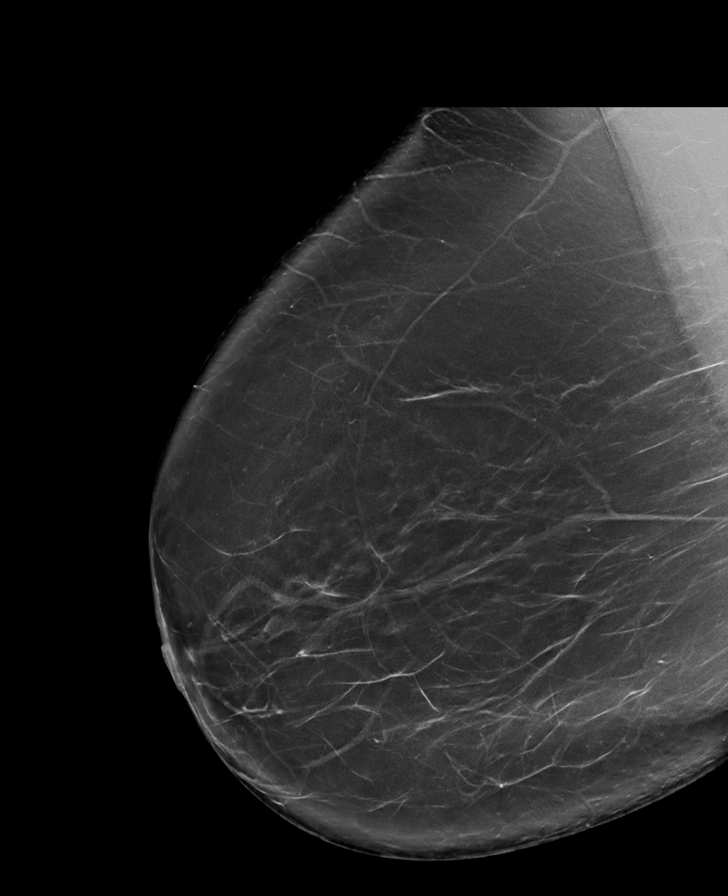

[L MLO synth-2D]
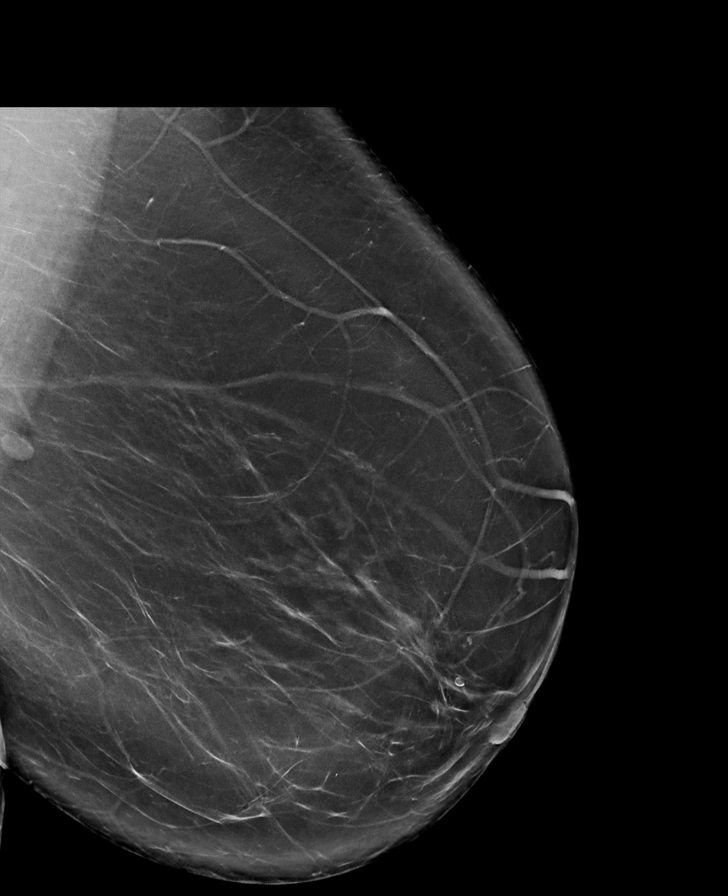

[L CC synth-2D]
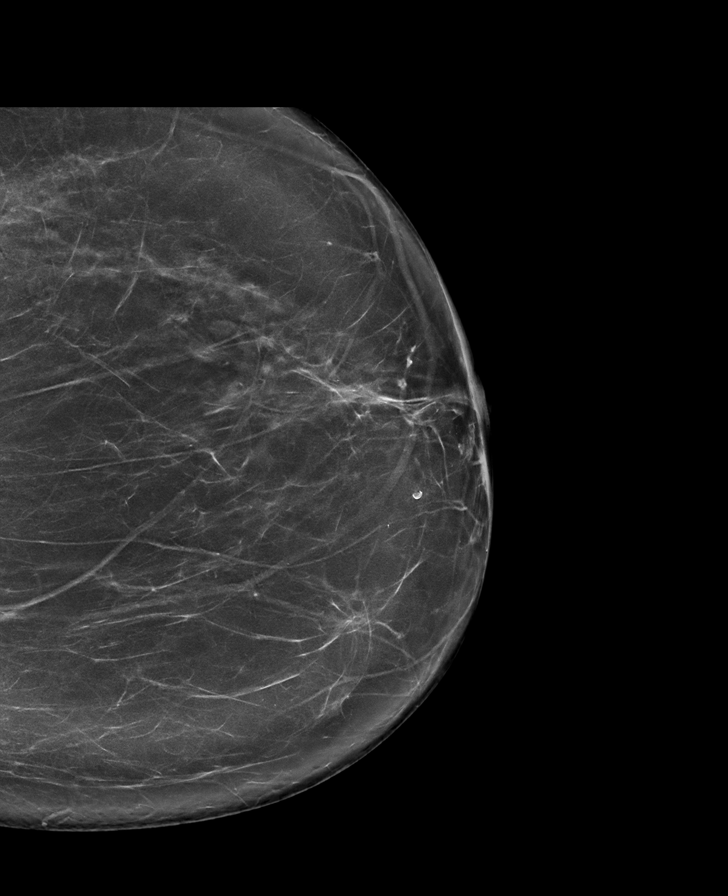

[R CC synth-2D]
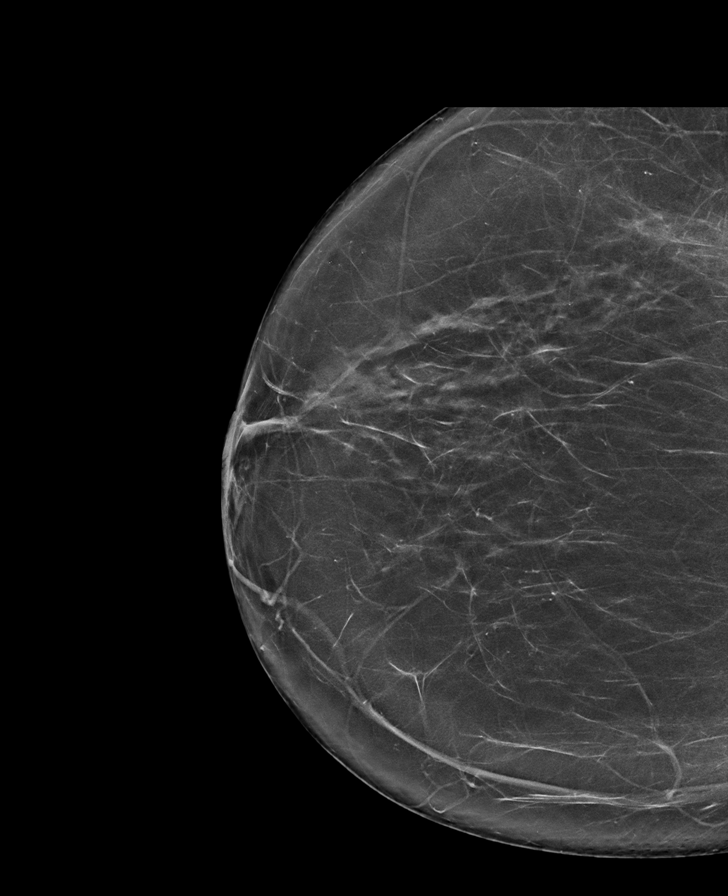

[L MLO tomo · tomo slice 50/99.0]
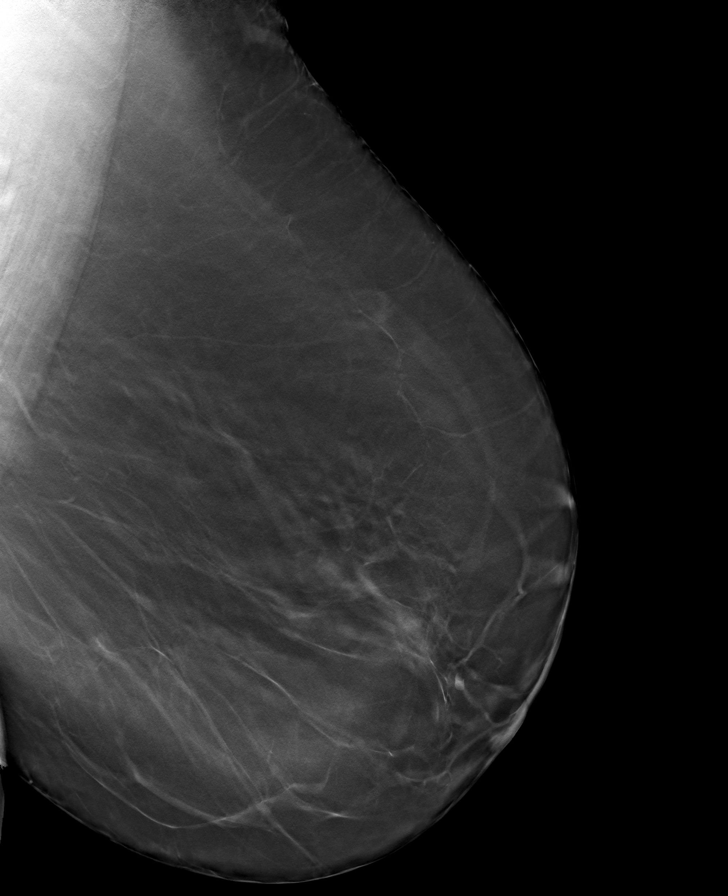

[L CC tomo · tomo slice 42/83.0]
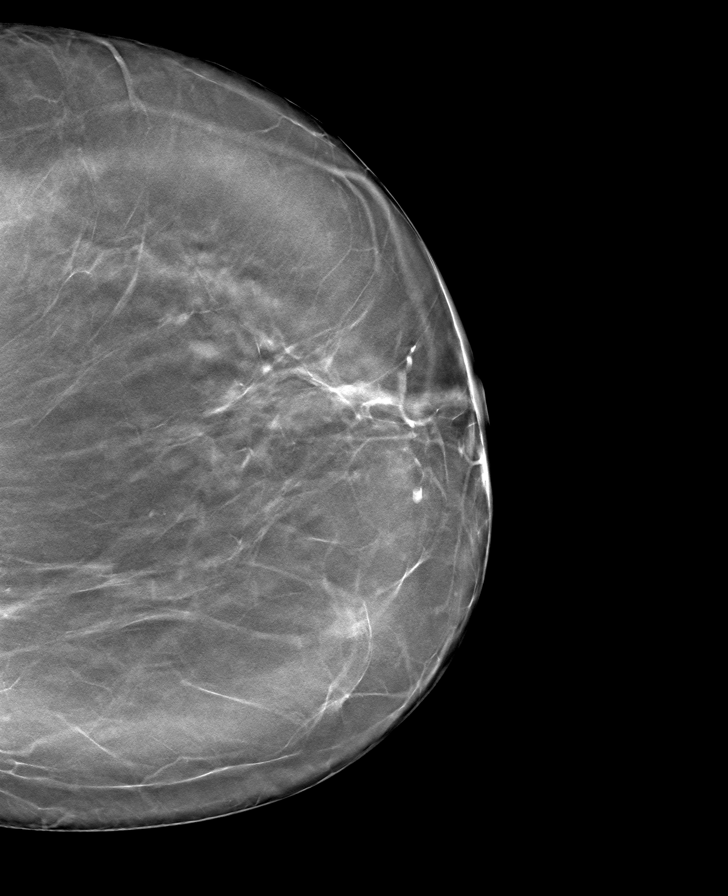

[R MLO tomo · tomo slice 51/102.0]
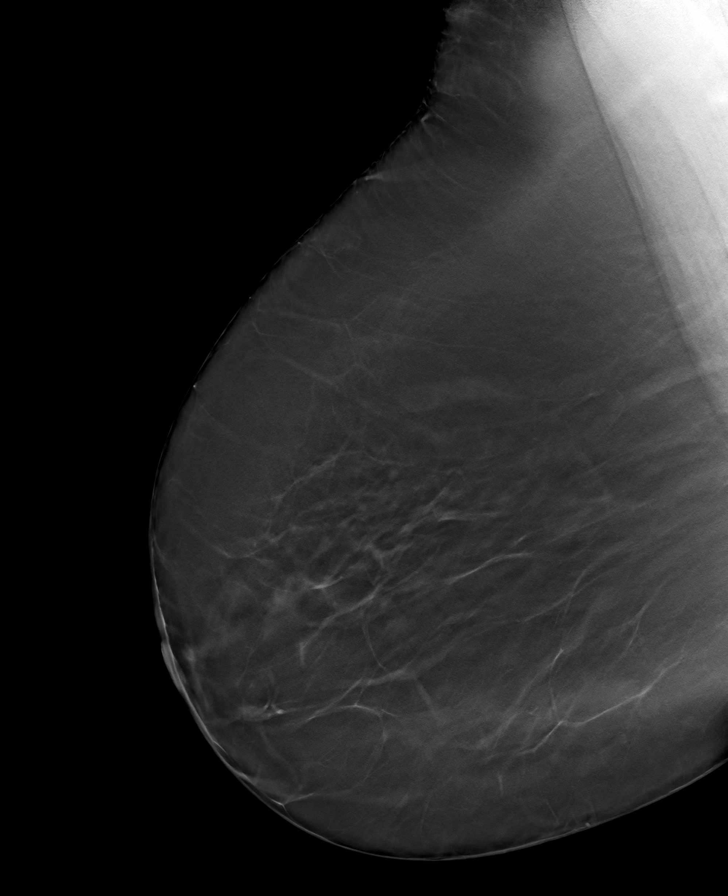

[R CC tomo · tomo slice 41/81.0]
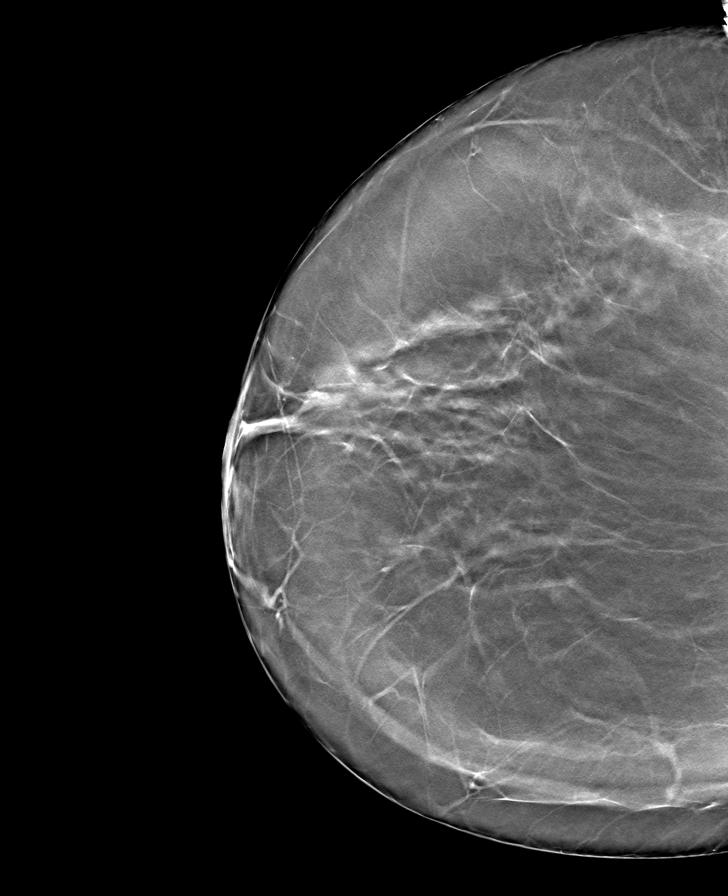

[8 of 24 positions shown; findings below may reference images not displayed]

ACR Breast Density Category b: There are scattered areas of
fibroglandular density.
FINDINGS: There are no findings suspicious for malignancy. Images were
processed with CAD.
IMPRESSION: No mammographic evidence of malignancy. A result letter of this
screening mammogram will be mailed directly to the patient.

RECOMMENDATION:
Screening mammogram in one year. (Code:[id number])

BI-RADS CATEGORY  1: Negative.

## 2023-01-03 ENCOUNTER — Other Ambulatory Visit: Payer: Self-pay | Admitting: Internal Medicine

## 2023-01-03 DIAGNOSIS — Z1231 Encounter for screening mammogram for malignant neoplasm of breast: Secondary | ICD-10-CM

## 2023-01-04 ENCOUNTER — Ambulatory Visit
Admission: RE | Admit: 2023-01-04 | Discharge: 2023-01-04 | Disposition: A | Discharge status: Home / Self Care | Payer: Medicare Other | Source: Ambulatory Visit | Attending: Internal Medicine | Admitting: Internal Medicine

## 2023-01-04 DIAGNOSIS — Z1231 Encounter for screening mammogram for malignant neoplasm of breast: Secondary | ICD-10-CM

## 2023-03-02 NOTE — Progress Notes (Signed)
PATIENT: Brittany Riley DOB: 05/03/1955  REASON FOR VISIT: follow up HISTORY FROM: patient  Chief Complaint  Patient presents with   Follow-up    Pt in 4  Pt here for cpap f/u Pt states congested in  am Pt has some SOB  Pt states  symptoms have been going on since before COVID      HISTORY OF PRESENT ILLNESS: Today 03/06/23:  Brittany Riley is a 67 y.o. female with a history of OSA on CPAP. Returns today for follow-up.  She reports that the CPAP is working well.  She has implemented some of the sleep hygiene techniques we discussed at the last visit.  She states that may have helped some.  She still wakes up 2 or 3 times at night.  Some of this is due to aches and pains.  She also reports that her husband moves a lot in his sleep so that could be affecting her sleep as well.  She does report that she has had some congestion over the last week however today she feels better.  Download is below       03/02/22: Brittany Riley is a 68 year old female with a history of obstructive sleep apnea on CPAP.  She returns today for follow-up.  She reports that the CPAP is working well.  She denies any new issues.  Has not really noticed much benefit in her fatigue or sleepiness since starting CPAP.  She states that some nights she only gets 5 to 6 hours of sleep.  Tends to go to bed around 11.  Reads her Kindle in bed.  Does drink caffeine throughout the day.  Download is below she returns today for an evaluation.      HISTORY January 28, 2020: I reviewed her AutoPap compliance data from 12/28/2019 through 01/26/2020, which is a total of 30 days, during which time she used her machine every night with percent use days greater than 4 h at 100%, indicating superb compliance, with an average usage of 8 h and 0 min, residual AHI at goal at 1.2/h, 95th percentile of pressure at 12.5 cm with a range of 7 to 14 cm with EPR, leak in the acceptable range with the 95th percentile at 12.7 L/min.   She reports that she is struggling with tolerance. The mask has bothered her from the get go but her husband got her some foam for padding. She had pressure points in the back of her head and also on the left side of her forehead. She has not really felt any better as far as her sleep quality. She sleeps still an average of 5 to 6 hours. She is motivated to continue with treatment. She has been using a full facemask. She has gained a little bit of weight. She is typically a left side sleeper. She does report that her nocturia is better. She has to get up at the most once per night when it used to be about 3 times per night on average. She reports that her mouth dryness is actually better.  REVIEW OF SYSTEMS: Out of a complete 14 system review of symptoms, the patient complains only of the following symptoms, and all other reviewed systems are negative.   ESS 2  ALLERGIES: Allergies  Allergen Reactions   Ampicillin    Penicillins Rash    HOME MEDICATIONS: Outpatient Medications Prior to Visit  Medication Sig Dispense Refill   carvedilol (COREG) 25 MG tablet Take 25 mg by mouth  2 (two) times daily.     Glucosamine HCl (GLUCOSAMINE PO) Take by mouth.     levothyroxine (SYNTHROID) 125 MCG tablet Take 125 mcg by mouth daily.     losartan-hydrochlorothiazide (HYZAAR) 100-12.5 MG tablet      olmesartan-hydrochlorothiazide (BENICAR HCT) 40-12.5 MG tablet Take 1 tablet by mouth daily.     SELENIUM PO Take by mouth.     amLODipine (NORVASC) 5 MG tablet Take 5 mg by mouth in the morning and at bedtime.     carvedilol (COREG) 12.5 MG tablet Take 12.5 mg by mouth 2 (two) times daily.     hydrochlorothiazide (MICROZIDE) 12.5 MG capsule Take 12.5 mg by mouth daily.     levothyroxine (SYNTHROID) 100 MCG tablet Take 100 mcg by mouth daily.     losartan (COZAAR) 100 MG tablet Take 100 mg by mouth daily.     No facility-administered medications prior to visit.    PAST MEDICAL HISTORY: Past Medical  History:  Diagnosis Date   Chronic insomnia    Dyslipidemia    Facial droop    Hemorrhoids    Hypertension    Hypothyroidism    Microscopic hematuria     PAST SURGICAL HISTORY: Past Surgical History:  Procedure Laterality Date   CESAREAN SECTION     CHOLECYSTECTOMY     CYSTOSCOPY     LAPAROSCOPIC CHOLECYSTECTOMY WITH COMMON DUCT EXPLORATION AND INTRAOP CHOLANGIOGRAM      FAMILY HISTORY: Family History  Problem Relation Age of Onset   Hypertension Mother    COPD Mother    Diabetes Father    Coronary artery disease Father    Hypertension Sister    Hodgkin's lymphoma Brother    Breast cancer Neg Hx    Sleep apnea Neg Hx     SOCIAL HISTORY: Social History   Socioeconomic History   Marital status: Married    Spouse name: Not on file   Number of children: 2   Years of education: College   Highest education level: Not on file  Occupational History   Occupation: None   Tobacco Use   Smoking status: Never   Smokeless tobacco: Never  Substance and Sexual Activity   Alcohol use: Yes    Alcohol/week: 0.0 standard drinks of alcohol    Comment: Rare   Drug use: No   Sexual activity: Not on file  Other Topics Concern   Not on file  Social History Narrative   Drinks 2 diet sodas a day    Social Determinants of Health   Financial Resource Strain: Not on file  Food Insecurity: Not on file  Transportation Needs: Not on file  Physical Activity: Not on file  Stress: Not on file  Social Connections: Not on file  Intimate Partner Violence: Not on file      PHYSICAL EXAM  Vitals:   03/06/23 1025  BP: (!) 142/73  Pulse: 68  SpO2: 97%  Weight: (!) 319 lb (144.7 kg)  Height: 5\' 6"  (1.676 m)    Body mass index is 51.49 kg/m.  Generalized: Well developed, in no acute distress  Chest: Lungs clear to auscultation bilaterally  Neurological examination  Mentation: Alert oriented to time, place, history taking. Follows all commands speech and language  fluent Cranial nerve II-XII: Extraocular movements were full, visual field were full on confrontational test Head turning and shoulder shrug  were normal and symmetric.    DIAGNOSTIC DATA (LABS, IMAGING, TESTING) - I reviewed patient records, labs, notes, testing and imaging myself  where available.     ASSESSMENT AND PLAN 67 y.o. year old female  has a past medical history of Chronic insomnia, Dyslipidemia, Facial droop, Hemorrhoids, Hypertension, Hypothyroidism, and Microscopic hematuria. here with:  OSA on CPAP Sleep disturbance  - CPAP compliance excellent - Good treatment of AHI  - Encourage patient to use CPAP nightly and > 4 hours each night - Continue practicing good sleep hygiene  - F/U in 1 year or sooner if needed   Butch Penny, MSN, NP-C 03/06/2023, 10:38 AM [facility] [address], [address] [place], [place] [address] [phone]

## 2023-03-06 ENCOUNTER — Ambulatory Visit (INDEPENDENT_AMBULATORY_CARE_PROVIDER_SITE_OTHER): Payer: Medicare Other | Admitting: Adult Health

## 2023-03-06 ENCOUNTER — Encounter: Payer: Self-pay | Admitting: Adult Health

## 2023-03-06 VITALS — BP 142/73 | HR 68 | Ht 66.0 in | Wt 319.0 lb

## 2023-03-06 DIAGNOSIS — G4733 Obstructive sleep apnea (adult) (pediatric): Secondary | ICD-10-CM | POA: Diagnosis not present

## 2023-03-06 NOTE — Patient Instructions (Signed)
Continue using CPAP nightly and greater than 4 hours each night °If your symptoms worsen or you develop new symptoms please let us know.  ° °

## 2023-12-28 ENCOUNTER — Other Ambulatory Visit: Payer: Self-pay | Admitting: Internal Medicine

## 2023-12-28 DIAGNOSIS — Z1231 Encounter for screening mammogram for malignant neoplasm of breast: Secondary | ICD-10-CM

## 2024-01-23 ENCOUNTER — Ambulatory Visit
Admission: RE | Admit: 2024-01-23 | Discharge: 2024-01-23 | Disposition: A | Discharge status: Home / Self Care | Source: Ambulatory Visit | Attending: Internal Medicine | Admitting: Internal Medicine

## 2024-01-23 DIAGNOSIS — Z1231 Encounter for screening mammogram for malignant neoplasm of breast: Secondary | ICD-10-CM

## 2024-03-06 ENCOUNTER — Ambulatory Visit: Payer: Medicare Other | Admitting: Adult Health

## 2024-03-06 ENCOUNTER — Encounter: Payer: Self-pay | Admitting: Adult Health

## 2024-03-06 VITALS — BP 128/68 | HR 63 | Ht 66.0 in

## 2024-03-06 DIAGNOSIS — G4733 Obstructive sleep apnea (adult) (pediatric): Secondary | ICD-10-CM | POA: Diagnosis not present

## 2024-03-06 NOTE — Patient Instructions (Signed)
 Continue using CPAP nightly and greater than 4 hours each night If your symptoms worsen or you develop new symptoms please let us  know.

## 2024-03-06 NOTE — Progress Notes (Signed)
 PATIENT: Brittany Riley DOB: January 22, 1956  REASON FOR VISIT: follow up HISTORY FROM: patient  Chief Complaint  Patient presents with   Follow-up    Rm 4, alone.  ESS 2.  Doing ok, stable.     HISTORY OF PRESENT ILLNESS: Today 03/06/24:  Brittany Riley is a 68 y.o. female with a history of OSA on CPAP. Returns today for follow-up.  She reports that she uses the CPAP nightly but still has not noticed the benefit of using it.  She states that her sleep has improved with the addition of melatonin extended release.  Currently using the fullface mask.  Download is below     03/06/23: [name] is a 68 y.o. female with a history of OSA on CPAP. Returns today for follow-up.  She reports that the CPAP is working well.  She has implemented some of the sleep hygiene techniques we discussed at the last visit.  She states that may have helped some.  She still wakes up 2 or 3 times at night.  Some of this is due to aches and pains.  She also reports that her husband moves a lot in his sleep so that could be affecting her sleep as well.  She does report that she has had some congestion over the last week however today she feels better.  Download is below       03/02/22: Brittany Riley is a 69 year old female with a history of obstructive sleep apnea on CPAP.  She returns today for follow-up.  She reports that the CPAP is working well.  She denies any new issues.  Has not really noticed much benefit in her fatigue or sleepiness since starting CPAP.  She states that some nights she only gets 5 to 6 hours of sleep.  Tends to go to bed around 11.  Reads her Kindle in bed.  Does drink caffeine throughout the day.  Download is below she returns today for an evaluation.      HISTORY January 28, 2020: I reviewed her AutoPap compliance data from 12/28/2019 through 01/26/2020, which is a total of 30 days, during which time she used her machine every night with percent use days greater  than 4 h at 100%, indicating superb compliance, with an average usage of 8 h and 0 min, residual AHI at goal at 1.2/h, 95th percentile of pressure at 12.5 cm with a range of 7 to 14 cm with EPR, leak in the acceptable range with the 95th percentile at 12.7 L/min.  She reports that she is struggling with tolerance. The mask has bothered her from the get go but her husband got her some foam for padding. She had pressure points in the back of her head and also on the left side of her forehead. She has not really felt any better as far as her sleep quality. She sleeps still an average of 5 to 6 hours. She is motivated to continue with treatment. She has been using a full facemask. She has gained a little bit of weight. She is typically a left side sleeper. She does report that her nocturia is better. She has to get up at the most once per night when it used to be about 3 times per night on average. She reports that her mouth dryness is actually better.  REVIEW OF SYSTEMS: Out of a complete 14 system review of symptoms, the patient complains only of the following symptoms, and all other reviewed systems are  negative.   ESS 2  ALLERGIES: Allergies  Allergen Reactions   Ampicillin    Penicillins Rash    HOME MEDICATIONS: Outpatient Medications Prior to Visit  Medication Sig Dispense Refill   carvedilol (COREG) 25 MG tablet Take 25 mg by mouth 2 (two) times daily.     Glucosamine HCl (GLUCOSAMINE PO) Take by mouth.     levothyroxine (SYNTHROID) 125 MCG tablet Take 125 mcg by mouth daily.     olmesartan-hydrochlorothiazide (BENICAR HCT) 40-12.5 MG tablet Take 1 tablet by mouth daily.     amLODipine (NORVASC) 5 MG tablet Take 5 mg by mouth in the morning and at bedtime.     carvedilol (COREG) 12.5 MG tablet Take 12.5 mg by mouth 2 (two) times daily.     hydrochlorothiazide (MICROZIDE) 12.5 MG capsule Take 12.5 mg by mouth daily.     levothyroxine (SYNTHROID) 100 MCG tablet Take 100 mcg by mouth daily.      losartan (COZAAR) 100 MG tablet Take 100 mg by mouth daily.     losartan-hydrochlorothiazide (HYZAAR) 100-12.5 MG tablet      SELENIUM PO Take by mouth.     No facility-administered medications prior to visit.    PAST MEDICAL HISTORY: Past Medical History:  Diagnosis Date   Chronic insomnia    Dyslipidemia    Facial droop    Hemorrhoids    Hypertension    Hypothyroidism    Microscopic hematuria     PAST SURGICAL HISTORY: Past Surgical History:  Procedure Laterality Date   CESAREAN SECTION     CHOLECYSTECTOMY     CYSTOSCOPY     LAPAROSCOPIC CHOLECYSTECTOMY WITH COMMON DUCT EXPLORATION AND INTRAOP CHOLANGIOGRAM      FAMILY HISTORY: Family History  Problem Relation Age of Onset   Hypertension Mother    COPD Mother    Diabetes Father    Coronary artery disease Father    Hypertension Sister    Hodgkin's lymphoma Brother    Breast cancer Neg Hx    Sleep apnea Neg Hx     SOCIAL HISTORY: Social History   Socioeconomic History   Marital status: Married    Spouse name: Not on file   Number of children: 2   Years of education: College   Highest education level: Not on file  Occupational History   Occupation: None   Tobacco Use   Smoking status: Never   Smokeless tobacco: Never  Substance and Sexual Activity   Alcohol  use: Yes    Alcohol /week: 0.0 standard drinks of alcohol     Comment: Rare   Drug use: No   Sexual activity: Not on file  Other Topics Concern   Not on file  Social History Narrative   Drinks 2 diet sodas a day    Social Drivers of Corporate Investment Banker Strain: Not on file  Food Insecurity: Not on file  Transportation Needs: Not on file  Physical Activity: Not on file  Stress: Not on file  Social Connections: Not on file  Intimate Partner Violence: Not on file      PHYSICAL EXAM  Vitals:   03/06/24 1014  BP: 128/68  Pulse: 63  Height: 5' 6 (1.676 m)    Body mass index is 51.49 kg/m.  Generalized: Well developed, in  no acute distress  Chest: Lungs clear to auscultation bilaterally  Neurological examination  Mentation: Alert oriented to time, place, history taking. Follows all commands speech and language fluent Cranial nerve II-XII: Extraocular movements were full, visual  field were full on confrontational test Head turning and shoulder shrug  were normal and symmetric.    DIAGNOSTIC DATA (LABS, IMAGING, TESTING) - I reviewed patient records, labs, notes, testing and imaging myself where available.     ASSESSMENT AND PLAN 68 y.o. year old female  has a past medical history of Chronic insomnia, Dyslipidemia, Facial droop, Hemorrhoids, Hypertension, Hypothyroidism, and Microscopic hematuria. here with:  OSA on CPAP Sleep disturbance  - CPAP compliance excellent - Good treatment of AHI  - Encourage patient to use CPAP nightly and > 4 hours each night - F/U in 1 year or sooner if needed   Duwaine Russell, MSN, NP-C 03/06/2024, 10:47 AM [facility] [address] [place], [place] [address] [phone]  The patient's condition requires frequent monitoring and adjustments in the treatment plan, reflecting the ongoing complexity of care.  This provider is the continuing focal point for all needed services for this condition.

## 2025-03-06 ENCOUNTER — Ambulatory Visit: Admitting: Adult Health
